# Patient Record
Sex: Female | Born: 1955 | Race: White | Hispanic: No | Marital: Married | Smoking: Former smoker
Health system: Southern US, Community
[De-identification: ages and names within clinical notes are randomized; demographics above are authoritative.]

## PROBLEM LIST (undated history)

## (undated) DIAGNOSIS — M1990 Unspecified osteoarthritis, unspecified site: Secondary | ICD-10-CM

## (undated) DIAGNOSIS — C44111 Basal cell carcinoma of skin of unspecified eyelid, including canthus: Secondary | ICD-10-CM

## (undated) DIAGNOSIS — T7840XA Allergy, unspecified, initial encounter: Secondary | ICD-10-CM

## (undated) DIAGNOSIS — G4730 Sleep apnea, unspecified: Secondary | ICD-10-CM

## (undated) DIAGNOSIS — C50911 Malignant neoplasm of unspecified site of right female breast: Secondary | ICD-10-CM

## (undated) DIAGNOSIS — IMO0002 Reserved for concepts with insufficient information to code with codable children: Secondary | ICD-10-CM

## (undated) DIAGNOSIS — D649 Anemia, unspecified: Secondary | ICD-10-CM

## (undated) DIAGNOSIS — C50919 Malignant neoplasm of unspecified site of unspecified female breast: Secondary | ICD-10-CM

## (undated) DIAGNOSIS — I10 Essential (primary) hypertension: Secondary | ICD-10-CM

## (undated) DIAGNOSIS — K649 Unspecified hemorrhoids: Secondary | ICD-10-CM

## (undated) DIAGNOSIS — D689 Coagulation defect, unspecified: Secondary | ICD-10-CM

## (undated) DIAGNOSIS — F258 Other schizoaffective disorders: Secondary | ICD-10-CM

## (undated) DIAGNOSIS — E669 Obesity, unspecified: Secondary | ICD-10-CM

## (undated) DIAGNOSIS — M109 Gout, unspecified: Secondary | ICD-10-CM

## (undated) DIAGNOSIS — Z9221 Personal history of antineoplastic chemotherapy: Secondary | ICD-10-CM

## (undated) DIAGNOSIS — R609 Edema, unspecified: Secondary | ICD-10-CM

## (undated) DIAGNOSIS — I509 Heart failure, unspecified: Secondary | ICD-10-CM

## (undated) HISTORY — DX: Coagulation defect, unspecified: D68.9

## (undated) HISTORY — DX: Gout, unspecified: M10.9

## (undated) HISTORY — PX: OTHER SURGICAL HISTORY: SHX169

## (undated) HISTORY — DX: Unspecified hemorrhoids: K64.9

## (undated) HISTORY — DX: Other schizoaffective disorders: F25.8

## (undated) HISTORY — DX: Malignant neoplasm of unspecified site of unspecified female breast: C50.919

## (undated) HISTORY — DX: Anemia, unspecified: D64.9

## (undated) HISTORY — PX: TUBAL LIGATION: SHX77

## (undated) HISTORY — DX: Allergy, unspecified, initial encounter: T78.40XA

## (undated) HISTORY — DX: Malignant neoplasm of unspecified site of right female breast: C50.911

## (undated) HISTORY — DX: Obesity, unspecified: E66.9

## (undated) HISTORY — DX: Basal cell carcinoma of skin of unspecified eyelid, including canthus: C44.111

## (undated) HISTORY — DX: Edema, unspecified: R60.9

## (undated) HISTORY — DX: Unspecified osteoarthritis, unspecified site: M19.90

## (undated) HISTORY — PX: ABDOMINAL HYSTERECTOMY: SHX81

## (undated) HISTORY — PX: CHOLECYSTECTOMY: SHX55

---

## 2002-05-05 DIAGNOSIS — F259 Schizoaffective disorder, unspecified: Secondary | ICD-10-CM

## 2002-05-05 HISTORY — DX: Schizoaffective disorder, unspecified: F25.9

## 2005-05-05 DIAGNOSIS — C50911 Malignant neoplasm of unspecified site of right female breast: Secondary | ICD-10-CM

## 2005-05-05 DIAGNOSIS — IMO0001 Reserved for inherently not codable concepts without codable children: Secondary | ICD-10-CM

## 2005-05-05 DIAGNOSIS — C50919 Malignant neoplasm of unspecified site of unspecified female breast: Secondary | ICD-10-CM

## 2005-05-05 DIAGNOSIS — Z9221 Personal history of antineoplastic chemotherapy: Secondary | ICD-10-CM

## 2005-05-05 HISTORY — DX: Personal history of antineoplastic chemotherapy: Z92.21

## 2005-05-05 HISTORY — DX: Malignant neoplasm of unspecified site of right female breast: C50.911

## 2005-05-05 HISTORY — DX: Malignant neoplasm of unspecified site of unspecified female breast: C50.919

## 2005-05-05 HISTORY — PX: BREAST LUMPECTOMY: SHX2

## 2005-05-05 HISTORY — DX: Reserved for inherently not codable concepts without codable children: IMO0001

## 2005-12-16 ENCOUNTER — Ambulatory Visit: Payer: Self-pay

## 2006-01-08 ENCOUNTER — Ambulatory Visit: Payer: Self-pay | Admitting: Surgery

## 2006-01-13 ENCOUNTER — Other Ambulatory Visit: Payer: Self-pay

## 2006-01-16 ENCOUNTER — Inpatient Hospital Stay: Payer: Self-pay | Admitting: Surgery

## 2006-01-26 ENCOUNTER — Ambulatory Visit: Payer: Self-pay | Admitting: Oncology

## 2006-02-16 ENCOUNTER — Ambulatory Visit: Payer: Self-pay | Admitting: Oncology

## 2006-03-05 ENCOUNTER — Ambulatory Visit: Payer: Self-pay | Admitting: Oncology

## 2006-03-09 ENCOUNTER — Other Ambulatory Visit: Payer: Self-pay

## 2006-03-12 ENCOUNTER — Ambulatory Visit: Payer: Self-pay | Admitting: Surgery

## 2006-04-04 ENCOUNTER — Ambulatory Visit: Payer: Self-pay | Admitting: Oncology

## 2006-05-05 ENCOUNTER — Ambulatory Visit: Payer: Self-pay | Admitting: Oncology

## 2006-06-05 ENCOUNTER — Ambulatory Visit: Payer: Self-pay | Admitting: Oncology

## 2006-07-04 ENCOUNTER — Ambulatory Visit: Payer: Self-pay | Admitting: Oncology

## 2006-07-22 ENCOUNTER — Ambulatory Visit: Payer: Self-pay | Admitting: Radiation Oncology

## 2006-08-04 ENCOUNTER — Ambulatory Visit: Payer: Self-pay | Admitting: Oncology

## 2006-08-04 ENCOUNTER — Ambulatory Visit: Payer: Self-pay | Admitting: Radiation Oncology

## 2006-09-03 ENCOUNTER — Ambulatory Visit: Payer: Self-pay | Admitting: Oncology

## 2006-09-03 ENCOUNTER — Ambulatory Visit: Payer: Self-pay | Admitting: Radiation Oncology

## 2006-10-04 ENCOUNTER — Ambulatory Visit: Payer: Self-pay | Admitting: Oncology

## 2006-10-04 ENCOUNTER — Ambulatory Visit: Payer: Self-pay | Admitting: Radiation Oncology

## 2006-11-03 ENCOUNTER — Ambulatory Visit: Payer: Self-pay | Admitting: Radiation Oncology

## 2006-11-03 ENCOUNTER — Ambulatory Visit: Payer: Self-pay | Admitting: Oncology

## 2006-12-04 ENCOUNTER — Ambulatory Visit: Payer: Self-pay | Admitting: Radiation Oncology

## 2006-12-04 ENCOUNTER — Ambulatory Visit: Payer: Self-pay | Admitting: Oncology

## 2006-12-14 ENCOUNTER — Ambulatory Visit: Payer: Self-pay | Admitting: Gastroenterology

## 2006-12-22 ENCOUNTER — Ambulatory Visit: Payer: Self-pay | Admitting: Gastroenterology

## 2006-12-23 ENCOUNTER — Ambulatory Visit: Payer: Self-pay

## 2007-01-04 ENCOUNTER — Ambulatory Visit: Payer: Self-pay | Admitting: Oncology

## 2007-02-03 ENCOUNTER — Ambulatory Visit: Payer: Self-pay | Admitting: Oncology

## 2007-02-25 ENCOUNTER — Ambulatory Visit: Payer: Self-pay | Admitting: Oncology

## 2007-03-06 ENCOUNTER — Ambulatory Visit: Payer: Self-pay | Admitting: Oncology

## 2007-05-06 ENCOUNTER — Ambulatory Visit: Payer: Self-pay | Admitting: Oncology

## 2007-05-19 ENCOUNTER — Ambulatory Visit: Payer: Self-pay | Admitting: Oncology

## 2007-06-06 ENCOUNTER — Ambulatory Visit: Payer: Self-pay | Admitting: Oncology

## 2007-09-03 ENCOUNTER — Ambulatory Visit: Payer: Self-pay | Admitting: Oncology

## 2007-09-30 ENCOUNTER — Ambulatory Visit: Payer: Self-pay | Admitting: Oncology

## 2007-10-04 ENCOUNTER — Ambulatory Visit: Payer: Self-pay | Admitting: Oncology

## 2007-12-24 ENCOUNTER — Ambulatory Visit: Payer: Self-pay | Admitting: Oncology

## 2008-01-04 ENCOUNTER — Ambulatory Visit: Payer: Self-pay | Admitting: Oncology

## 2008-01-20 ENCOUNTER — Ambulatory Visit: Payer: Self-pay | Admitting: Oncology

## 2008-02-03 ENCOUNTER — Ambulatory Visit: Payer: Self-pay | Admitting: Oncology

## 2008-02-11 ENCOUNTER — Ambulatory Visit: Payer: Self-pay | Admitting: Family Medicine

## 2008-05-05 ENCOUNTER — Ambulatory Visit: Payer: Self-pay | Admitting: Oncology

## 2008-05-11 ENCOUNTER — Ambulatory Visit: Payer: Self-pay | Admitting: Oncology

## 2008-06-05 ENCOUNTER — Ambulatory Visit: Payer: Self-pay | Admitting: Oncology

## 2008-09-25 ENCOUNTER — Ambulatory Visit: Payer: Self-pay | Admitting: Neurosurgery

## 2008-10-20 ENCOUNTER — Ambulatory Visit (HOSPITAL_COMMUNITY): Admission: RE | Admit: 2008-10-20 | Discharge: 2008-10-21 | Payer: Self-pay | Admitting: Neurosurgery

## 2008-11-02 ENCOUNTER — Ambulatory Visit: Payer: Self-pay | Admitting: Oncology

## 2008-11-09 ENCOUNTER — Ambulatory Visit: Payer: Self-pay | Admitting: Oncology

## 2008-12-03 ENCOUNTER — Ambulatory Visit: Payer: Self-pay | Admitting: Oncology

## 2009-01-03 ENCOUNTER — Ambulatory Visit: Payer: Self-pay | Admitting: Oncology

## 2009-02-02 ENCOUNTER — Ambulatory Visit: Payer: Self-pay | Admitting: Oncology

## 2009-03-05 ENCOUNTER — Ambulatory Visit: Payer: Self-pay | Admitting: Oncology

## 2009-05-05 ENCOUNTER — Ambulatory Visit: Payer: Self-pay | Admitting: Oncology

## 2009-05-31 ENCOUNTER — Ambulatory Visit: Payer: Self-pay | Admitting: Oncology

## 2009-06-05 ENCOUNTER — Ambulatory Visit: Payer: Self-pay | Admitting: Oncology

## 2009-12-03 ENCOUNTER — Ambulatory Visit: Payer: Self-pay | Admitting: Oncology

## 2009-12-05 ENCOUNTER — Ambulatory Visit: Payer: Self-pay | Admitting: Oncology

## 2010-01-03 ENCOUNTER — Ambulatory Visit: Payer: Self-pay | Admitting: Oncology

## 2010-02-02 ENCOUNTER — Ambulatory Visit: Payer: Self-pay | Admitting: Oncology

## 2010-05-30 ENCOUNTER — Ambulatory Visit: Payer: Self-pay | Admitting: Oncology

## 2010-05-31 LAB — CANCER ANTIGEN 27.29: CA 27.29: 11.1 U/mL (ref 0.0–38.6)

## 2010-06-05 ENCOUNTER — Ambulatory Visit: Payer: Self-pay | Admitting: Oncology

## 2010-08-12 LAB — BASIC METABOLIC PANEL
Calcium: 9.4 mg/dL (ref 8.4–10.5)
GFR calc Af Amer: >60 mL/min (ref >60)
GFR calc non Af Amer: >60 mL/min (ref >60)
Glucose, Bld: 107 mg/dL — ABNORMAL HIGH (ref 70–99)
Sodium: 142 mEq/L (ref 135–145)

## 2010-08-12 LAB — GLUCOSE, CAPILLARY
Glucose-Capillary: 149 mg/dL — ABNORMAL HIGH (ref 70–99)
Glucose-Capillary: 163 mg/dL — ABNORMAL HIGH (ref 70–99)

## 2010-08-12 LAB — CBC
Hemoglobin: 13.3 g/dL (ref 12.0–15.0)
RBC: 4.6 MIL/uL (ref 3.87–5.11)
RDW: 15.9 % — ABNORMAL HIGH (ref 11.5–15.5)

## 2010-09-17 NOTE — Op Note (Signed)
NAME:  Amy Buchanan, FREELS NO.:  [id number]   MEDICAL RECORD NO.:  [id number]          PATIENT TYPE:  OIB   LOCATION:  3534                         FACILITY:  [facility]   PHYSICIAN:  Danae Orleans. Venetia Maxon, M.D.  DATE OF BIRTH:  [birth year]   DATE OF PROCEDURE:  10/20/2008  DATE OF DISCHARGE:                               OPERATIVE REPORT   PREOPERATIVE DIAGNOSES:  Cervical stenosis, cervical spondylosis,  cervical disk herniation, cervical radiculopathy, and cervical  myelopathy with herniated cervical disks at C4-5, C5-6, and C6-7 levels.   POSTOPERATIVE DIAGNOSES:  Cervical stenosis, cervical spondylosis,  cervical disk herniation, cervical radiculopathy, and cervical  myelopathy with herniated cervical disks at C4-5, C5-6, and C6-7 levels.   PROCEDURE:  Anterior cervical decompression and fusion at C4-5, C5-6,  and C6-7 levels with allograft bone grafts, morselized bone autograft,  EquivaBone, and anterior cervical plate.   SURGEON:  Danae Orleans. Venetia Maxon, MD   ASSISTANT:  1. Hewitt Shorts, MD  2. Georgiann Cocker, RN   ANESTHESIA:  General endotracheal anesthesia.   ESTIMATED BLOOD LOSS:  150 mL.   COMPLICATIONS:  None.   DISPOSITION:  Recovery.   INDICATIONS:  Amy Buchanan is a 55 year old woman with cervical  spondylosis, herniated cervical disks, right greater than left arm pain  and weakness, and cervical stenosis at the C4-5, C5-6, and C6-7 levels.  It was elected to take her to surgery for anterior cervical  decompression and fusion at these affected levels.   PROCEDURE:  [name] was brought to the operating room.  Following  satisfactory and uncomplicated induction of general endotracheal  anesthesia and placement of intravenous lines, the patient was placed in  the supine position on the operating table.  She was placed in 5 pounds  of halter traction.  Her shoulders were taped down with ABD pads and  also wrapped because of the patient's extremely  large size.  Her  anterior neck was then prepped and draped in the usual sterile fashion.  Area of planned incision was infiltrated with local lidocaine.  An  incision was made from midline to the anterior border of the  sternocleidomastoid on the left side of midline and carried to the  platysma layer, exposing the anterior border of the sternocleidomastoid  muscle.  Using blunt dissection, the carotid sheath was kept lateral and  trachea and esophagus kept medial exposing the anterior cervical spine.  A bent spinal needle was placed in what was felt to be the C3-4 and C4-5  levels and this was confirmed on intraoperative x-ray.  Subsequently,  the longus colli muscles were taken down from the anterior cervical  spine from C4-C7 levels using electrocautery, and Key elevator and a  shadow line retractor along with up and down retractor blades were  placed to facilitate exposure.  Distraction pins were placed initially  at C4 and C7, and using gentle distraction, the interspace were incised  and disk material was removed in a piecemeal fashion.  Endplates were  stripped of residual disk material.  A thorough diskectomy was performed  after initially decorticating the endplates with a  high-speed drill and  bone autograft was saved for later use with bone grafting.  Uncinate  spurs were removed and the central spinal cord dura was decompressed as  were both neural foramina.  Hemostasis was assured, and after trial  sizing, a 6-mm allograft bone wedge was selected and fashioned with a  high-speed drill, packed with morselized bone autograft and EquivaBone,  inserted in the interspace countersunk appropriately.  Attention was  then turned to the C6-7 level where a similar decompression was  performed.  The distraction pins were removed at C6-C7 and there was a  significant amount of herniated disk material on the right side of  midline with significant compression of the right C7 nerve root.   Both  the C7 nerve roots and cervical spinal cord dura were decompressed and  after trial sizing an 8-mm bone allograft, wedge was selected and  fashioned with a high-speed drill, packed with morcellized bone  autograft and EquivaBone, inserted in the interspace and countersunk  appropriately.  Attention was then turned to the C5-6 level and  distraction pins were removed at C5 and C6.  The endplates were  decorticated and uncinate spurs were drilled down.  Spinal cord dura and  both C6 nerve roots were widely decompressed.  Hemostasis was assured,  and after trial sizing, a 7-mm allograft bone wedge was selected and  fashioned with a high-speed drill, packed with morcellized bone  autograft and EquivaBone, inserted in the interspace and countersunk  appropriately.  A 48-mm Trestle anterior cervical plate was affixed to  anterior cervical spine after traction weight was removed with 14 mm  variable angle screws, two at C4, two at C5, two at C6, and two at C7,  all screws had excellent purchase.  Locking mechanisms were engaged.  Final x-ray demonstrated well-positioned of interbody graft and the  anterior cervical plate at the upper aspect of the construct.  Because  of the patient's large body habitus, it was not possible to visualize  the lower aspect.  The wound was irrigated.  Soft tissue were inspected  and found to be in good repair.  There was no evidence of any bleeding.  The platysma layer was closed with 3-0 Vicryl sutures and skin edges  were re-approximated with 3-0 Vicryl subcuticular stitch.  Wound was  dressed with Dermabond.  The patient was extubated in the operating room  and taken to the recovery room in stable satisfactory condition having  tolerated the operation well.  Counts were correct at the end of the  case.      Danae Orleans. Venetia Maxon, M.D.  Electronically Signed     JDS/MEDQ  D:  10/20/2008  T:  10/21/2008  Job:  [id number]

## 2010-11-29 ENCOUNTER — Ambulatory Visit: Payer: Self-pay | Admitting: Oncology

## 2010-11-30 LAB — CANCER ANTIGEN 27.29: CA 27.29: 9.3 U/mL (ref 0.0–38.6)

## 2010-12-04 ENCOUNTER — Ambulatory Visit: Payer: Self-pay | Admitting: Oncology

## 2011-01-20 ENCOUNTER — Ambulatory Visit: Payer: Self-pay | Admitting: Oncology

## 2011-06-09 ENCOUNTER — Ambulatory Visit: Payer: Self-pay | Admitting: Oncology

## 2011-06-09 LAB — CBC CANCER CENTER
Eosinophil %: 2.4 %
Lymphocyte #: 2.1 x10 3/mm (ref 1.0–3.6)
MCH: 27.4 pg (ref 26.0–34.0)
MCV: 83 fL (ref 80–100)
Monocyte #: 0.6 x10 3/mm (ref 0.0–0.7)
Platelet: 282 x10 3/mm (ref 150–440)
WBC: 9.9 x10 3/mm (ref 3.6–11.0)

## 2011-06-09 LAB — COMPREHENSIVE METABOLIC PANEL
Albumin: 3.4 g/dL (ref 3.4–5.0)
Anion Gap: 9 (ref 7–16)
BUN: 13 mg/dL (ref 7–18)
Bilirubin,Total: 0.2 mg/dL (ref 0.2–1.0)
Chloride: 102 mmol/L (ref 98–107)
Creatinine: 0.88 mg/dL (ref 0.60–1.30)
EGFR (African American): >60
Glucose: 127 mg/dL — ABNORMAL HIGH (ref 65–99)
Potassium: 3.8 mmol/L (ref 3.5–5.1)
SGOT(AST): 51 U/L — ABNORMAL HIGH (ref 15–37)
SGPT (ALT): 67 U/L
Total Protein: 6.6 g/dL (ref 6.4–8.2)

## 2011-07-04 ENCOUNTER — Ambulatory Visit: Payer: Self-pay | Admitting: Oncology

## 2011-08-07 ENCOUNTER — Ambulatory Visit: Payer: Self-pay | Admitting: General Surgery

## 2011-08-22 ENCOUNTER — Ambulatory Visit: Payer: Self-pay | Admitting: General Surgery

## 2011-12-17 ENCOUNTER — Ambulatory Visit: Payer: Self-pay | Admitting: Oncology

## 2011-12-17 LAB — COMPREHENSIVE METABOLIC PANEL
Albumin: 3.7 g/dL (ref 3.4–5.0)
Alkaline Phosphatase: 97 U/L (ref 50–136)
Calcium, Total: 8.8 mg/dL (ref 8.5–10.1)
Co2: 25 mmol/L (ref 21–32)
EGFR (Non-African Amer.): 57 — ABNORMAL LOW
Glucose: 166 mg/dL — ABNORMAL HIGH (ref 65–99)
SGOT(AST): 14 U/L — ABNORMAL LOW (ref 15–37)
SGPT (ALT): 32 U/L (ref 12–78)
Sodium: 139 mmol/L (ref 136–145)

## 2011-12-17 LAB — CBC CANCER CENTER
Basophil %: 0.8 %
Eosinophil #: 0.1 x10 3/mm (ref 0.0–0.7)
Lymphocyte %: 16.8 %
Monocyte #: 0.3 x10 3/mm (ref 0.2–0.9)
Neutrophil %: 78.7 %
Platelet: 250 x10 3/mm (ref 150–440)
RDW: 18.3 % — ABNORMAL HIGH (ref 11.5–14.5)
WBC: 10.7 x10 3/mm (ref 3.6–11.0)

## 2011-12-18 LAB — CANCER ANTIGEN 27.29: CA 27.29: 9.9 U/mL (ref 0.0–38.6)

## 2012-01-04 ENCOUNTER — Ambulatory Visit: Payer: Self-pay | Admitting: Oncology

## 2012-02-03 ENCOUNTER — Ambulatory Visit: Payer: Self-pay | Admitting: Oncology

## 2012-02-18 ENCOUNTER — Ambulatory Visit: Payer: Self-pay | Admitting: Family Medicine

## 2012-05-05 HISTORY — PX: UPPER GI ENDOSCOPY: SHX6162

## 2012-05-05 HISTORY — PX: COLONOSCOPY: SHX174

## 2012-06-08 ENCOUNTER — Ambulatory Visit: Payer: Self-pay | Admitting: Oncology

## 2012-06-09 LAB — COMPREHENSIVE METABOLIC PANEL
Albumin: 3.5 g/dL (ref 3.4–5.0)
Alkaline Phosphatase: 130 U/L (ref 50–136)
Anion Gap: 5 — ABNORMAL LOW (ref 7–16)
BUN: 18 mg/dL (ref 7–18)
Calcium, Total: 8.8 mg/dL (ref 8.5–10.1)
Chloride: 102 mmol/L (ref 98–107)
Creatinine: 1 mg/dL (ref 0.60–1.30)
EGFR (Non-African Amer.): >60
Osmolality: 283 (ref 275–301)
Potassium: 4.5 mmol/L (ref 3.5–5.1)
Sodium: 141 mmol/L (ref 136–145)

## 2012-06-09 LAB — CBC CANCER CENTER
Basophil #: 0.1 x10 3/mm (ref 0.0–0.1)
Basophil %: 0.9 %
Eosinophil %: 2.2 %
HCT: 42.8 % (ref 35.0–47.0)
HGB: 13.8 g/dL (ref 12.0–16.0)
Lymphocyte #: 2.4 x10 3/mm (ref 1.0–3.6)
MCV: 77 fL — ABNORMAL LOW (ref 80–100)
Monocyte #: 1.1 x10 3/mm — ABNORMAL HIGH (ref 0.2–0.9)
Platelet: 302 x10 3/mm (ref 150–440)
RBC: 5.59 10*6/uL — ABNORMAL HIGH (ref 3.80–5.20)
RDW: 19 % — ABNORMAL HIGH (ref 11.5–14.5)
WBC: 14.5 x10 3/mm — ABNORMAL HIGH (ref 3.6–11.0)

## 2012-06-10 LAB — CANCER ANTIGEN 27.29: CA 27.29: 7.4 U/mL (ref 0.0–38.6)

## 2012-07-03 ENCOUNTER — Ambulatory Visit: Payer: Self-pay | Admitting: Oncology

## 2012-09-21 ENCOUNTER — Ambulatory Visit: Payer: Self-pay | Admitting: Family Medicine

## 2012-09-23 ENCOUNTER — Inpatient Hospital Stay: Payer: Self-pay | Admitting: Specialist

## 2012-09-23 LAB — COMPREHENSIVE METABOLIC PANEL
Albumin: 4 g/dL (ref 3.4–5.0)
BUN: 12 mg/dL (ref 7–18)
Calcium, Total: 9.2 mg/dL (ref 8.5–10.1)
Co2: 29 mmol/L (ref 21–32)
Glucose: 100 mg/dL — ABNORMAL HIGH (ref 65–99)
Osmolality: 279 (ref 275–301)
Potassium: 3.6 mmol/L (ref 3.5–5.1)

## 2012-09-23 LAB — CBC
HGB: 12.5 g/dL (ref 12.0–16.0)
MCHC: 31.6 g/dL — ABNORMAL LOW (ref 32.0–36.0)
MCV: 77 fL — ABNORMAL LOW (ref 80–100)
RBC: 5.11 10*6/uL (ref 3.80–5.20)

## 2012-09-23 LAB — URINALYSIS, COMPLETE
Bacteria: NONE SEEN
Glucose,UR: NEGATIVE mg/dL (ref 0–75)
Ph: 5 (ref 4.5–8.0)
Protein: NEGATIVE
RBC,UR: 1 /HPF (ref 0–5)
Specific Gravity: 1.01 (ref 1.003–1.030)
Squamous Epithelial: 1

## 2012-09-23 LAB — CK TOTAL AND CKMB (NOT AT ARMC)
CK, Total: 99 U/L (ref 21–215)
CK-MB: 0.8 ng/mL (ref 0.5–3.6)
CK-MB: 0.6 ng/mL (ref 0.5–3.6)

## 2012-09-23 LAB — TROPONIN I: Troponin-I: <0.02 ng/mL

## 2012-09-23 LAB — PRO B NATRIURETIC PEPTIDE: B-Type Natriuretic Peptide: 567 pg/mL — ABNORMAL HIGH (ref 0–125)

## 2012-09-24 LAB — BASIC METABOLIC PANEL
Anion Gap: 4 — ABNORMAL LOW (ref 7–16)
Calcium, Total: 8.5 mg/dL (ref 8.5–10.1)
Co2: 31 mmol/L (ref 21–32)
EGFR (African American): >60
EGFR (Non-African Amer.): >60
Osmolality: 279 (ref 275–301)
Potassium: 3.7 mmol/L (ref 3.5–5.1)
Sodium: 140 mmol/L (ref 136–145)

## 2012-09-24 LAB — CK TOTAL AND CKMB (NOT AT ARMC)
CK, Total: 72 U/L (ref 21–215)
CK-MB: <0.5 ng/mL — ABNORMAL LOW (ref 0.5–3.6)

## 2012-09-25 LAB — BASIC METABOLIC PANEL
Anion Gap: 8 (ref 7–16)
BUN: 12 mg/dL (ref 7–18)
Chloride: 103 mmol/L (ref 98–107)
Co2: 27 mmol/L (ref 21–32)
Creatinine: 0.77 mg/dL (ref 0.60–1.30)
EGFR (African American): >60
EGFR (Non-African Amer.): >60
Glucose: 115 mg/dL — ABNORMAL HIGH (ref 65–99)

## 2013-01-24 ENCOUNTER — Ambulatory Visit: Payer: Self-pay | Admitting: Oncology

## 2013-02-22 ENCOUNTER — Ambulatory Visit: Payer: Self-pay | Admitting: Oncology

## 2013-02-22 LAB — COMPREHENSIVE METABOLIC PANEL
Alkaline Phosphatase: 100 U/L (ref 50–136)
Anion Gap: 10 (ref 7–16)
BUN: 16 mg/dL (ref 7–18)
Bilirubin,Total: 0.6 mg/dL (ref 0.2–1.0)
Chloride: 105 mmol/L (ref 98–107)
Co2: 27 mmol/L (ref 21–32)
Creatinine: 1.13 mg/dL (ref 0.60–1.30)
Osmolality: 284 (ref 275–301)
Sodium: 142 mmol/L (ref 136–145)

## 2013-02-22 LAB — CBC CANCER CENTER
Basophil #: 0.1 x10 3/mm (ref 0.0–0.1)
Eosinophil #: 0.2 x10 3/mm (ref 0.0–0.7)
HCT: 41.2 % (ref 35.0–47.0)
Lymphocyte %: 19.4 %
MCH: 26.8 pg (ref 26.0–34.0)
Monocyte #: 0.7 x10 3/mm (ref 0.2–0.9)
Neutrophil %: 70.4 %
Platelet: 241 x10 3/mm (ref 150–440)
RBC: 4.97 10*6/uL (ref 3.80–5.20)

## 2013-03-05 ENCOUNTER — Ambulatory Visit: Payer: Self-pay | Admitting: Oncology

## 2013-03-28 ENCOUNTER — Inpatient Hospital Stay: Payer: Self-pay | Admitting: Surgery

## 2013-03-28 LAB — COMPREHENSIVE METABOLIC PANEL
Alkaline Phosphatase: 99 U/L
BUN: 16 mg/dL (ref 7–18)
Creatinine: 1.05 mg/dL (ref 0.60–1.30)
EGFR (African American): >60
Glucose: 126 mg/dL — ABNORMAL HIGH (ref 65–99)
Potassium: 3.7 mmol/L (ref 3.5–5.1)
SGOT(AST): 10 U/L — ABNORMAL LOW (ref 15–37)
SGPT (ALT): 19 U/L (ref 12–78)

## 2013-03-28 LAB — CBC WITH DIFFERENTIAL/PLATELET
Basophil #: 0.1 10*3/uL (ref 0.0–0.1)
Basophil %: 0.6 %
Eosinophil #: 0.2 10*3/uL (ref 0.0–0.7)
HCT: 39.7 % (ref 35.0–47.0)
HGB: 12.6 g/dL (ref 12.0–16.0)
Lymphocyte #: 4 10*3/uL — ABNORMAL HIGH (ref 1.0–3.6)
Lymphocyte %: 23.2 %
MCV: 83 fL (ref 80–100)
Monocyte #: 1.3 x10 3/mm — ABNORMAL HIGH (ref 0.2–0.9)
Monocyte %: 7.6 %
Neutrophil #: 11.5 10*3/uL — ABNORMAL HIGH (ref 1.4–6.5)
Platelet: 282 10*3/uL (ref 150–440)
RBC: 4.81 10*6/uL (ref 3.80–5.20)
WBC: 17.2 10*3/uL — ABNORMAL HIGH (ref 3.6–11.0)

## 2013-03-28 LAB — PROTIME-INR
INR: 1.2
Prothrombin Time: 15.3 secs — ABNORMAL HIGH (ref 11.5–14.7)

## 2013-03-28 LAB — APTT: Activated PTT: 33.4 secs (ref 23.6–35.9)

## 2013-03-29 LAB — URINALYSIS, COMPLETE
Bilirubin,UR: NEGATIVE
Glucose,UR: NEGATIVE mg/dL (ref 0–75)
Ketone: NEGATIVE
Leukocyte Esterase: NEGATIVE
Ph: 5 (ref 4.5–8.0)
RBC,UR: 2 /HPF (ref 0–5)
Specific Gravity: 1.019 (ref 1.003–1.030)
WBC UR: 2 /HPF (ref 0–5)

## 2013-03-29 LAB — CBC WITH DIFFERENTIAL/PLATELET
Bands: 21 %
Basophil #: 0.1 10*3/uL (ref 0.0–0.1)
Basophil %: 0.3 %
Eosinophil #: 0 10*3/uL (ref 0.0–0.7)
Eosinophil %: 0.1 %
HCT: 28.2 % — ABNORMAL LOW (ref 35.0–47.0)
Lymphocyte %: 5.9 %
Lymphocytes: 14 %
MCH: 27 pg (ref 26.0–34.0)
MCHC: 33.6 g/dL (ref 32.0–36.0)
MCV: 84 fL (ref 80–100)
Metamyelocyte: 1 %
Monocyte #: 1.8 x10 3/mm — ABNORMAL HIGH (ref 0.2–0.9)
Monocyte %: 7.1 %
Myelocyte: 1 %
Platelet: 254 10*3/uL (ref 150–440)
Platelet: 222 10*3/uL (ref 150–440)
RBC: 3.37 10*6/uL — ABNORMAL LOW (ref 3.80–5.20)
Segmented Neutrophils: 56 %
WBC: 24.7 10*3/uL — ABNORMAL HIGH (ref 3.6–11.0)

## 2013-03-29 LAB — BASIC METABOLIC PANEL
BUN: 22 mg/dL — ABNORMAL HIGH (ref 7–18)
BUN: 20 mg/dL — ABNORMAL HIGH (ref 7–18)
Calcium, Total: 7.8 mg/dL — ABNORMAL LOW (ref 8.5–10.1)
Calcium, Total: 7 mg/dL — CL (ref 8.5–10.1)
Chloride: 113 mmol/L — ABNORMAL HIGH (ref 98–107)
Co2: 22 mmol/L (ref 21–32)
Creatinine: 0.98 mg/dL (ref 0.60–1.30)
Creatinine: 1.13 mg/dL (ref 0.60–1.30)
EGFR (African American): >60
EGFR (African American): >60
EGFR (Non-African Amer.): >60
EGFR (Non-African Amer.): 54 — ABNORMAL LOW
Glucose: 178 mg/dL — ABNORMAL HIGH (ref 65–99)
Osmolality: 297 (ref 275–301)
Osmolality: 301 (ref 275–301)
Potassium: 3.8 mmol/L (ref 3.5–5.1)
Sodium: 146 mmol/L — ABNORMAL HIGH (ref 136–145)
Sodium: 148 mmol/L — ABNORMAL HIGH (ref 136–145)

## 2013-03-29 LAB — HEMOGLOBIN: HGB: 8.7 g/dL — ABNORMAL LOW (ref 12.0–16.0)

## 2013-03-30 LAB — BASIC METABOLIC PANEL
Anion Gap: 2 — ABNORMAL LOW (ref 7–16)
BUN: 20 mg/dL — ABNORMAL HIGH (ref 7–18)
Co2: 28 mmol/L (ref 21–32)
EGFR (African American): >60
Osmolality: 295 (ref 275–301)
Sodium: 145 mmol/L (ref 136–145)

## 2013-03-30 LAB — HEMOGLOBIN: HGB: 7 g/dL — ABNORMAL LOW (ref 12.0–16.0)

## 2013-03-30 LAB — CBC WITH DIFFERENTIAL/PLATELET
Basophil #: 0 10*3/uL (ref 0.0–0.1)
Eosinophil #: 0 10*3/uL (ref 0.0–0.7)
Eosinophil %: 0 %
HCT: 22.1 % — ABNORMAL LOW (ref 35.0–47.0)
HGB: 7.5 g/dL — ABNORMAL LOW (ref 12.0–16.0)
Lymphocyte #: 1.5 10*3/uL (ref 1.0–3.6)
Lymphocyte %: 10.5 %
MCHC: 33.9 g/dL (ref 32.0–36.0)
MCV: 82 fL (ref 80–100)
Monocyte %: 8.3 %
Neutrophil #: 11.2 10*3/uL — ABNORMAL HIGH (ref 1.4–6.5)
RBC: 2.69 10*6/uL — ABNORMAL LOW (ref 3.80–5.20)

## 2013-04-01 LAB — CBC WITH DIFFERENTIAL/PLATELET
Basophil #: 0 10*3/uL (ref 0.0–0.1)
Basophil #: 0 10*3/uL (ref 0.0–0.1)
Basophil %: 0.3 %
Eosinophil #: 0.1 10*3/uL (ref 0.0–0.7)
Eosinophil #: 0.1 10*3/uL (ref 0.0–0.7)
Eosinophil %: 1.2 %
Eosinophil %: 0.9 %
HCT: 21.9 % — ABNORMAL LOW (ref 35.0–47.0)
HCT: 23 % — ABNORMAL LOW (ref 35.0–47.0)
HGB: 7.8 g/dL — ABNORMAL LOW (ref 12.0–16.0)
Lymphocyte #: 1.1 10*3/uL (ref 1.0–3.6)
Lymphocyte #: 1.6 10*3/uL (ref 1.0–3.6)
Lymphocyte %: 12 %
Lymphocyte %: 11 %
MCH: 29.5 pg (ref 26.0–34.0)
MCHC: 34.5 g/dL (ref 32.0–36.0)
MCV: 86 fL (ref 80–100)
MCV: 85 fL (ref 80–100)
Monocyte #: 1.1 x10 3/mm — ABNORMAL HIGH (ref 0.2–0.9)
Monocyte %: 11.7 %
Neutrophil #: 7.2 10*3/uL — ABNORMAL HIGH (ref 1.4–6.5)
Neutrophil #: 11 10*3/uL — ABNORMAL HIGH (ref 1.4–6.5)
Neutrophil %: 77 %
RBC: 2.7 10*6/uL — ABNORMAL LOW (ref 3.80–5.20)
RDW: 16.5 % — ABNORMAL HIGH (ref 11.5–14.5)
WBC: 9.6 10*3/uL (ref 3.6–11.0)
WBC: 14.3 10*3/uL — ABNORMAL HIGH (ref 3.6–11.0)

## 2013-04-01 LAB — FIBRINOGEN: Fibrinogen: 341 mg/dL (ref 210–470)

## 2013-04-01 LAB — PROTIME-INR
INR: 1.2
Prothrombin Time: 15.6 secs — ABNORMAL HIGH (ref 11.5–14.7)

## 2013-04-02 LAB — BASIC METABOLIC PANEL
Anion Gap: 4 — ABNORMAL LOW (ref 7–16)
BUN: 10 mg/dL (ref 7–18)
Calcium, Total: 7.6 mg/dL — ABNORMAL LOW (ref 8.5–10.1)
Chloride: 111 mmol/L — ABNORMAL HIGH (ref 98–107)
Glucose: 134 mg/dL — ABNORMAL HIGH (ref 65–99)
Osmolality: 284 (ref 275–301)

## 2013-04-02 LAB — CBC WITH DIFFERENTIAL/PLATELET
Bands: 5 %
Eosinophil: 1 %
HCT: 28 % — ABNORMAL LOW (ref 35.0–47.0)
Lymphocytes: 13 %
MCH: 28.7 pg (ref 26.0–34.0)
MCHC: 33.8 g/dL (ref 32.0–36.0)
Metamyelocyte: 1 %
Monocytes: 7 %
Myelocyte: 2 %
NRBC/100 WBC: 3 /
RBC: 3.29 10*6/uL — ABNORMAL LOW (ref 3.80–5.20)
Segmented Neutrophils: 70 %

## 2013-04-03 LAB — CBC WITH DIFFERENTIAL/PLATELET
Bands: 8 %
Eosinophil: 1 %
HCT: 27 % — ABNORMAL LOW (ref 35.0–47.0)
HGB: 9 g/dL — ABNORMAL LOW (ref 12.0–16.0)
Lymphocytes: 12 %
MCH: 28.5 pg (ref 26.0–34.0)
MCHC: 33.2 g/dL (ref 32.0–36.0)
MCV: 86 fL (ref 80–100)
Monocytes: 2 %
Myelocyte: 1 %
NRBC/100 WBC: 2 /
Platelet: 205 10*3/uL (ref 150–440)
RDW: 15.9 % — ABNORMAL HIGH (ref 11.5–14.5)
WBC: 19.4 10*3/uL — ABNORMAL HIGH (ref 3.6–11.0)

## 2013-04-03 LAB — URINALYSIS, COMPLETE
Glucose,UR: NEGATIVE mg/dL (ref 0–75)
Leukocyte Esterase: NEGATIVE
Nitrite: NEGATIVE
RBC,UR: 3 /HPF (ref 0–5)
Specific Gravity: 1.021 (ref 1.003–1.030)
Squamous Epithelial: 2
WBC UR: 13 /HPF (ref 0–5)

## 2013-04-04 LAB — CBC WITH DIFFERENTIAL/PLATELET
Bands: 5 %
Basophil: 3 %
MCH: 28 pg (ref 26.0–34.0)
MCHC: 32.7 g/dL (ref 32.0–36.0)
MCV: 86 fL (ref 80–100)
Monocytes: 3 %
Platelet: 246 10*3/uL (ref 150–440)
RBC: 2.82 10*6/uL — ABNORMAL LOW (ref 3.80–5.20)
RDW: 16 % — ABNORMAL HIGH (ref 11.5–14.5)
WBC: 21.1 10*3/uL — ABNORMAL HIGH (ref 3.6–11.0)

## 2013-04-05 LAB — CBC WITH DIFFERENTIAL/PLATELET
Basophil #: 0.1 10*3/uL (ref 0.0–0.1)
Basophil %: 0.7 %
Eosinophil #: 0.3 10*3/uL (ref 0.0–0.7)
Eosinophil %: 1.4 %
HCT: 23.3 % — ABNORMAL LOW (ref 35.0–47.0)
HGB: 7.8 g/dL — ABNORMAL LOW (ref 12.0–16.0)
Lymphocyte #: 1.4 10*3/uL (ref 1.0–3.6)
Lymphocyte %: 6.3 %
MCHC: 33.5 g/dL (ref 32.0–36.0)
Monocyte %: 4.5 %
Neutrophil #: 18.9 10*3/uL — ABNORMAL HIGH (ref 1.4–6.5)
Neutrophil %: 87.1 %
Platelet: 282 10*3/uL (ref 150–440)
RDW: 16.1 % — ABNORMAL HIGH (ref 11.5–14.5)
WBC: 21.7 10*3/uL — ABNORMAL HIGH (ref 3.6–11.0)

## 2013-04-06 LAB — CBC WITH DIFFERENTIAL/PLATELET
Bands: 8 %
HCT: 25.6 % — ABNORMAL LOW (ref 35.0–47.0)
Lymphocytes: 9 %
MCHC: 33.5 g/dL (ref 32.0–36.0)
Metamyelocyte: 3 %
Myelocyte: 2 %
NRBC/100 WBC: 1 /
Platelet: 327 10*3/uL (ref 150–440)
RDW: 16.9 % — ABNORMAL HIGH (ref 11.5–14.5)
Segmented Neutrophils: 73 %

## 2013-04-06 LAB — BASIC METABOLIC PANEL
BUN: 7 mg/dL (ref 7–18)
Calcium, Total: 7.6 mg/dL — ABNORMAL LOW (ref 8.5–10.1)
Chloride: 103 mmol/L (ref 98–107)
Co2: 27 mmol/L (ref 21–32)
Creatinine: 0.67 mg/dL (ref 0.60–1.30)
EGFR (African American): >60
EGFR (Non-African Amer.): >60
Glucose: 129 mg/dL — ABNORMAL HIGH (ref 65–99)
Osmolality: 275 (ref 275–301)

## 2013-04-07 LAB — BASIC METABOLIC PANEL
Creatinine: 0.81 mg/dL (ref 0.60–1.30)
EGFR (Non-African Amer.): >60
Glucose: 126 mg/dL — ABNORMAL HIGH (ref 65–99)
Osmolality: 278 (ref 275–301)
Potassium: 3.2 mmol/L — ABNORMAL LOW (ref 3.5–5.1)
Sodium: 139 mmol/L (ref 136–145)

## 2013-04-07 LAB — CBC WITH DIFFERENTIAL/PLATELET
Eosinophil: 2 %
HCT: 25 % — ABNORMAL LOW (ref 35.0–47.0)
Lymphocytes: 6 %
Metamyelocyte: 1 %
Monocytes: 4 %
Myelocyte: 1 %
NRBC/100 WBC: 1 /
RBC: 2.94 10*6/uL — ABNORMAL LOW (ref 3.80–5.20)
Segmented Neutrophils: 82 %
WBC: 24.5 10*3/uL — ABNORMAL HIGH (ref 3.6–11.0)

## 2013-04-08 LAB — BASIC METABOLIC PANEL
BUN: 9 mg/dL (ref 7–18)
Chloride: 104 mmol/L (ref 98–107)
Co2: 31 mmol/L (ref 21–32)
Creatinine: 0.79 mg/dL (ref 0.60–1.30)
EGFR (African American): >60
EGFR (Non-African Amer.): >60
Glucose: 108 mg/dL — ABNORMAL HIGH (ref 65–99)
Osmolality: 279 (ref 275–301)
Potassium: 3.2 mmol/L — ABNORMAL LOW (ref 3.5–5.1)
Sodium: 140 mmol/L (ref 136–145)

## 2013-04-08 LAB — CBC WITH DIFFERENTIAL/PLATELET
Basophil: 1 %
Comment - H1-Com3: NORMAL
Eosinophil: 3 %
HCT: 24.2 % — ABNORMAL LOW (ref 35.0–47.0)
Lymphocytes: 19 %
MCH: 28.5 pg (ref 26.0–34.0)
MCHC: 33.5 g/dL (ref 32.0–36.0)
Monocytes: 4 %
RDW: 16.9 % — ABNORMAL HIGH (ref 11.5–14.5)
Segmented Neutrophils: 73 %
WBC: 17.2 10*3/uL — ABNORMAL HIGH (ref 3.6–11.0)

## 2013-04-09 LAB — BASIC METABOLIC PANEL
Anion Gap: 7 (ref 7–16)
BUN: 9 mg/dL (ref 7–18)
Calcium, Total: 7.6 mg/dL — ABNORMAL LOW (ref 8.5–10.1)
Co2: 30 mmol/L (ref 21–32)
Creatinine: 0.71 mg/dL (ref 0.60–1.30)
EGFR (Non-African Amer.): >60
Glucose: 115 mg/dL — ABNORMAL HIGH (ref 65–99)
Potassium: 3.5 mmol/L (ref 3.5–5.1)
Sodium: 139 mmol/L (ref 136–145)

## 2013-04-09 LAB — MAGNESIUM: Magnesium: 1.8 mg/dL

## 2013-04-09 LAB — CBC WITH DIFFERENTIAL/PLATELET
Eosinophil #: 0.3 10*3/uL (ref 0.0–0.7)
Eosinophil %: 2.2 %
HCT: 23.5 % — ABNORMAL LOW (ref 35.0–47.0)
HGB: 7.9 g/dL — ABNORMAL LOW (ref 12.0–16.0)
Lymphocyte #: 1.3 10*3/uL (ref 1.0–3.6)
MCV: 85 fL (ref 80–100)
Monocyte #: 1 x10 3/mm — ABNORMAL HIGH (ref 0.2–0.9)
Neutrophil #: 10.5 10*3/uL — ABNORMAL HIGH (ref 1.4–6.5)
Neutrophil %: 80.2 %
Platelet: 358 10*3/uL (ref 150–440)

## 2013-04-11 LAB — HEMOGLOBIN: HGB: 7.9 g/dL — ABNORMAL LOW (ref 12.0–16.0)

## 2013-05-17 ENCOUNTER — Ambulatory Visit: Payer: Self-pay | Admitting: Surgery

## 2013-05-17 LAB — CBC WITH DIFFERENTIAL/PLATELET
BASOS ABS: 0.1 10*3/uL (ref 0.0–0.1)
Basophil %: 0.9 %
Eosinophil #: 0.2 10*3/uL (ref 0.0–0.7)
Eosinophil %: 2.3 %
HCT: 36.1 % (ref 35.0–47.0)
HGB: 11.8 g/dL — AB (ref 12.0–16.0)
LYMPHS ABS: 1.6 10*3/uL (ref 1.0–3.6)
LYMPHS PCT: 16.2 %
MCH: 25.8 pg — ABNORMAL LOW (ref 26.0–34.0)
MCHC: 32.7 g/dL (ref 32.0–36.0)
MCV: 79 fL — AB (ref 80–100)
Monocyte #: 0.5 x10 3/mm (ref 0.2–0.9)
Monocyte %: 5.4 %
NEUTROS ABS: 7.4 10*3/uL — AB (ref 1.4–6.5)
NEUTROS PCT: 75.2 %
Platelet: 365 10*3/uL (ref 150–440)
RBC: 4.57 10*6/uL (ref 3.80–5.20)
RDW: 16.2 % — AB (ref 11.5–14.5)
WBC: 9.9 10*3/uL (ref 3.6–11.0)

## 2013-05-17 LAB — BASIC METABOLIC PANEL
Anion Gap: 4 — ABNORMAL LOW (ref 7–16)
BUN: 9 mg/dL (ref 7–18)
CALCIUM: 9.2 mg/dL (ref 8.5–10.1)
CO2: 31 mmol/L (ref 21–32)
Chloride: 101 mmol/L (ref 98–107)
Creatinine: 0.68 mg/dL (ref 0.60–1.30)
EGFR (African American): >60
EGFR (Non-African Amer.): >60
GLUCOSE: 171 mg/dL — AB (ref 65–99)
OSMOLALITY: 275 (ref 275–301)
Potassium: 3.1 mmol/L — ABNORMAL LOW (ref 3.5–5.1)
SODIUM: 136 mmol/L (ref 136–145)

## 2013-06-02 ENCOUNTER — Ambulatory Visit: Payer: Self-pay | Admitting: Surgery

## 2013-08-23 ENCOUNTER — Ambulatory Visit: Payer: Self-pay | Admitting: Oncology

## 2013-08-23 LAB — COMPREHENSIVE METABOLIC PANEL
ALBUMIN: 3.8 g/dL (ref 3.4–5.0)
ALT: 23 U/L (ref 12–78)
Alkaline Phosphatase: 124 U/L — ABNORMAL HIGH
Anion Gap: 8 (ref 7–16)
BILIRUBIN TOTAL: 0.3 mg/dL (ref 0.2–1.0)
BUN: 13 mg/dL (ref 7–18)
Calcium, Total: 9 mg/dL (ref 8.5–10.1)
Chloride: 101 mmol/L (ref 98–107)
Co2: 32 mmol/L (ref 21–32)
Creatinine: 0.94 mg/dL (ref 0.60–1.30)
GLUCOSE: 110 mg/dL — AB (ref 65–99)
OSMOLALITY: 282 (ref 275–301)
Potassium: 3.9 mmol/L (ref 3.5–5.1)
SGOT(AST): 11 U/L — ABNORMAL LOW (ref 15–37)
Sodium: 141 mmol/L (ref 136–145)
Total Protein: 7.6 g/dL (ref 6.4–8.2)

## 2013-08-23 LAB — CBC CANCER CENTER
BASOS ABS: 0.1 x10 3/mm (ref 0.0–0.1)
Basophil %: 0.8 %
Eosinophil #: 0.2 x10 3/mm (ref 0.0–0.7)
Eosinophil %: 1.7 %
HCT: 42 % (ref 35.0–47.0)
HGB: 13.2 g/dL (ref 12.0–16.0)
LYMPHS ABS: 2.6 x10 3/mm (ref 1.0–3.6)
Lymphocyte %: 23.4 %
MCH: 22.8 pg — ABNORMAL LOW (ref 26.0–34.0)
MCHC: 31.4 g/dL — AB (ref 32.0–36.0)
MCV: 73 fL — ABNORMAL LOW (ref 80–100)
MONOS PCT: 7.4 %
Monocyte #: 0.8 x10 3/mm (ref 0.2–0.9)
Neutrophil #: 7.5 x10 3/mm — ABNORMAL HIGH (ref 1.4–6.5)
Neutrophil %: 66.7 %
PLATELETS: 296 x10 3/mm (ref 150–440)
RBC: 5.79 10*6/uL — AB (ref 3.80–5.20)
RDW: 18.4 % — AB (ref 11.5–14.5)
WBC: 11.2 x10 3/mm — AB (ref 3.6–11.0)

## 2013-09-02 ENCOUNTER — Ambulatory Visit: Payer: Self-pay | Admitting: Oncology

## 2013-12-13 ENCOUNTER — Ambulatory Visit: Payer: Self-pay | Admitting: Urgent Care

## 2013-12-13 LAB — COMPREHENSIVE METABOLIC PANEL
ALBUMIN: 3.8 g/dL (ref 3.4–5.0)
ALT: 26 U/L
ANION GAP: 8 (ref 7–16)
Alkaline Phosphatase: 130 U/L — ABNORMAL HIGH
BUN: 10 mg/dL (ref 7–18)
Bilirubin,Total: 0.4 mg/dL (ref 0.2–1.0)
CHLORIDE: 103 mmol/L (ref 98–107)
CREATININE: 0.91 mg/dL (ref 0.60–1.30)
Calcium, Total: 8.9 mg/dL (ref 8.5–10.1)
Co2: 30 mmol/L (ref 21–32)
EGFR (African American): >60
EGFR (Non-African Amer.): >60
GLUCOSE: 76 mg/dL (ref 65–99)
OSMOLALITY: 279 (ref 275–301)
POTASSIUM: 3.5 mmol/L (ref 3.5–5.1)
SGOT(AST): 24 U/L (ref 15–37)
Sodium: 141 mmol/L (ref 136–145)
Total Protein: 8.2 g/dL (ref 6.4–8.2)

## 2013-12-13 LAB — CBC WITH DIFFERENTIAL/PLATELET
BASOS ABS: 0.1 10*3/uL (ref 0.0–0.1)
Basophil %: 0.8 %
EOS ABS: 0.2 10*3/uL (ref 0.0–0.7)
Eosinophil %: 1.3 %
HCT: 42 % (ref 35.0–47.0)
HGB: 13.4 g/dL (ref 12.0–16.0)
LYMPHS ABS: 4 10*3/uL — AB (ref 1.0–3.6)
Lymphocyte %: 29 %
MCH: 24.1 pg — AB (ref 26.0–34.0)
MCHC: 32 g/dL (ref 32.0–36.0)
MCV: 76 fL — AB (ref 80–100)
MONOS PCT: 7.6 %
Monocyte #: 1 x10 3/mm — ABNORMAL HIGH (ref 0.2–0.9)
Neutrophil #: 8.4 10*3/uL — ABNORMAL HIGH (ref 1.4–6.5)
Neutrophil %: 61.3 %
Platelet: 368 10*3/uL (ref 150–440)
RBC: 5.56 10*6/uL — ABNORMAL HIGH (ref 3.80–5.20)
RDW: 17.9 % — AB (ref 11.5–14.5)
WBC: 13.8 10*3/uL — ABNORMAL HIGH (ref 3.6–11.0)

## 2013-12-13 LAB — LIPASE, BLOOD: Lipase: 153 U/L (ref 73–393)

## 2013-12-15 ENCOUNTER — Ambulatory Visit: Payer: Self-pay | Admitting: Urgent Care

## 2013-12-16 ENCOUNTER — Other Ambulatory Visit: Payer: Self-pay | Admitting: Urgent Care

## 2013-12-16 LAB — CBC WITH DIFFERENTIAL/PLATELET
BASOS ABS: 0.1 10*3/uL (ref 0.0–0.1)
BASOS PCT: 0.6 %
EOS PCT: 1.3 %
Eosinophil #: 0.1 10*3/uL (ref 0.0–0.7)
HCT: 41.1 % (ref 35.0–47.0)
HGB: 13 g/dL (ref 12.0–16.0)
Lymphocyte #: 1.7 10*3/uL (ref 1.0–3.6)
Lymphocyte %: 16.8 %
MCH: 24 pg — ABNORMAL LOW (ref 26.0–34.0)
MCHC: 31.6 g/dL — ABNORMAL LOW (ref 32.0–36.0)
MCV: 76 fL — AB (ref 80–100)
MONO ABS: 0.3 x10 3/mm (ref 0.2–0.9)
Monocyte %: 2.8 %
NEUTROS ABS: 8 10*3/uL — AB (ref 1.4–6.5)
Neutrophil %: 78.5 %
PLATELETS: 313 10*3/uL (ref 150–440)
RBC: 5.43 10*6/uL — AB (ref 3.80–5.20)
RDW: 17.8 % — AB (ref 11.5–14.5)
WBC: 10.2 10*3/uL (ref 3.6–11.0)

## 2013-12-23 ENCOUNTER — Ambulatory Visit: Payer: Self-pay | Admitting: Family Medicine

## 2014-01-26 ENCOUNTER — Ambulatory Visit: Payer: Self-pay | Admitting: Oncology

## 2014-03-07 ENCOUNTER — Ambulatory Visit: Payer: Self-pay | Admitting: Oncology

## 2014-03-07 LAB — CBC CANCER CENTER
Basophil #: 0.1 x10 3/mm (ref 0.0–0.1)
Basophil %: 0.5 %
Eosinophil #: 0.1 x10 3/mm (ref 0.0–0.7)
Eosinophil %: 1.2 %
HCT: 41.2 % (ref 35.0–47.0)
HGB: 13 g/dL (ref 12.0–16.0)
Lymphocyte #: 2.5 x10 3/mm (ref 1.0–3.6)
Lymphocyte %: 20.8 %
MCH: 24.4 pg — AB (ref 26.0–34.0)
MCHC: 31.4 g/dL — ABNORMAL LOW (ref 32.0–36.0)
MCV: 78 fL — ABNORMAL LOW (ref 80–100)
MONO ABS: 0.8 x10 3/mm (ref 0.2–0.9)
Monocyte %: 6.5 %
Neutrophil #: 8.6 x10 3/mm — ABNORMAL HIGH (ref 1.4–6.5)
Neutrophil %: 71 %
Platelet: 273 x10 3/mm (ref 150–440)
RBC: 5.32 10*6/uL — AB (ref 3.80–5.20)
RDW: 18.7 % — AB (ref 11.5–14.5)
WBC: 12.1 x10 3/mm — ABNORMAL HIGH (ref 3.6–11.0)

## 2014-03-07 LAB — CREATININE, SERUM: Creatine, Serum: 0.92

## 2014-03-07 LAB — COMPREHENSIVE METABOLIC PANEL
ALBUMIN: 3.9 g/dL (ref 3.4–5.0)
ALT: 29 U/L
AST: 17 U/L (ref 15–37)
Alkaline Phosphatase: 113 U/L
Anion Gap: 7 (ref 7–16)
BUN: 13 mg/dL (ref 7–18)
Bilirubin,Total: 0.4 mg/dL (ref 0.2–1.0)
CALCIUM: 9.1 mg/dL (ref 8.5–10.1)
CO2: 28 mmol/L (ref 21–32)
CREATININE: 0.92 mg/dL (ref 0.60–1.30)
Chloride: 102 mmol/L (ref 98–107)
EGFR (African American): >60
Glucose: 109 mg/dL — ABNORMAL HIGH (ref 65–99)
Osmolality: 275 (ref 275–301)
Potassium: 4 mmol/L (ref 3.5–5.1)
Sodium: 137 mmol/L (ref 136–145)
Total Protein: 7.5 g/dL (ref 6.4–8.2)

## 2014-04-04 ENCOUNTER — Ambulatory Visit: Payer: Self-pay | Admitting: Oncology

## 2014-04-05 ENCOUNTER — Ambulatory Visit: Payer: Self-pay | Admitting: Family Medicine

## 2014-08-25 NOTE — Op Note (Signed)
PATIENT NAME:  APREL, EGELHOFF MR#:  [id number] DATE OF BIRTH:  [birth year]  DATE OF PROCEDURE:  04/03/2013  OPERATION PERFORMED: Debridement of subcutaneous tissue and fascia.   PREOPERATIVE DIAGNOSIS: Necrotizing soft tissue infection.   POSTOPERATIVE DIAGNOSIS:  Necrotizing soft tissue infection, mostly fat with a little fascia.   SURGEON: Consuela Mimes, M.D.   ANESTHESIA: General.   PROCEDURE IN DETAIL: The patient was placed supine on the Operating Room table and prepped and draped in the usual sterile fashion. All of the staples were removed and the patient was noted to have a classic necrotizing soft tissue infection with extremely foul-smelling, very watery, blackish fluid. There were areas of subcutaneous tissue that were viable but there were also areas that were nonviable. There areas that were nonviable, for the most part, only had a depth of a centimeter or less but there were a few tracks that were a couple of centimeters long. There was some dead fascia but, for the most part, the fascia looked healthy. The debridement was undertaken sharply with the electrocautery and also bluntly with Russian forceps. All of the necrotic tissue was debrided. The suture line was intact. The hemostasis was achieved with electrocautery and then the wound was irrigated with copious amounts of serial hydrogen peroxide and, at the conclusion of the irrigation, all of the remaining tissue was quite viable. A large black VAC sponge was placed in the wound and the remainder of the VAC appliance was applied and connected to 100 cm of water continuous suction with minimal leak. The patient tolerated the procedure well. There were no complications.   ____________________________ Consuela Mimes, MD wfm:cs D: 04/03/2013 12:55:11 ET T: 04/03/2013 19:50:52 ET JOB#: [id number]  cc: Consuela Mimes, MD, <Dictator> Consuela Mimes MD ELECTRONICALLY SIGNED 04/11/2013 23:34

## 2014-08-25 NOTE — Op Note (Signed)
PATIENT NAME:  Amy Buchanan, Amy Buchanan MR#:  [id number] DATE OF BIRTH:  17-Sep-1955  DATE OF PROCEDURE:  03/29/2013  PREOPERATIVE DIAGNOSIS: Massive upper gastrointestinal hemorrhage.   POSTOPERATIVE DIAGNOSES: Massive upper gastrointestinal hemorrhage, gastric ulcer disease, multiple gastric polyps.   PROCEDURES PERFORMED:  1. Exploratory laparotomy.  2. Gastrotomy.  3. Oversew of gastric ulcer along the lesser curvature.  4. Wedge excision of portion of greater curvature of the stomach.   SURGEON: Sherri Rad, M.D. FACS  ASSISTANT: Surgical scrub technologist.   TYPE OF ANESTHESIA: [facility], general oroendotracheal.   FINDINGS: 1.5 cm bleeding lesser curvature ulcer, multiple gastrec polyps. pedunculated antral polyp.  SPECIMENS: Portion of the stomach with contained polyp off the greater curvature near antrum   DESCRIPTION OF PROCEDURE: With informed consent, supine position, general oroendotracheal anesthesia was induced. The patient's abdomen was widely prepped and draped with ChloraPrep solution. Timeout was observed.   The abdomen was entered through a midline incision from the xiphoid down to the umbilicus. A self-retaining abdominal retractor was placed. Musculofascial layers being divided with electrocautery. The stomach was markedly distended, and there was evidence of blood within the small intestine. The stomach was palpated. There was no obvious mass. A trasnverse gastrotomy was fashioned at the midportion of the anterior wall of the stomach after the omentum was reflected off the portion of the greater curvature utilizing the LigaSure apparatus allowed entrance into the lesser sac. A large amount of clot with particulate matter was evacuated. This was totaling at least 1 to 1.5 liters. Irrigation of the gastric contents and mucosa demonstrated a hemoclip seen at the gastroesophageal junction placed earlier this evening. This did not appear to be bleeding. Along the  greater curvature was a small polyp which was hyperemic in nature. This was oversewn with several #0 silk sutures in figure-of-eight orientation. Directly opposite this along the greater curvature was a 1.5 cm gastric ulcer with heaping of the mucosa and some exudate on top of it which appeared to be the source of bleeding. Distal to the gastrotomy, there was a palpable pedunculated polyp along the greater curvature. Following the ulcer being oversewn with several figure-of-eight #0 Vicryl sutures and hemostasis being ensured on the operative field in that location, attention was then turned to the greater curvature of the stomach. A small gastrotomy was fashioned directly over the polyp. This was a long pedunculated polyp which appeared to not have any signs of active bleeding. Wedge resection was then achieved utilizing multiple fires of the GIA 75 stapler. Nasogastric tube was replaced by anesthesia and confirmed within the body of the stomach by palpation. Gastrotomy was then closed utilizing 2 fires of the TA-60 stapler with thick load. All staple lines were reinforced with seromuscular 2-0 silk suture. The abdomen at this point was copiously irrigated and aspirated dry. Hemostasis being ensured on the operative field with point cautery and subcutaneous tissues and lap and needle count correct x 2, the fascia was closed in the extremes of the wound with running #1 looped PDS. Subcutaneous tissues were irrigated. Change of gown and gloves was performed prior to fascial closure. Skin edges were reapproximated utilizing a skin stapler. The patient was then subsequently transferred to the intensive care unit in stable and satisfactory condition by anesthesia services.   ____________________________ Jeannette How Marina Gravel, MD FACS mab:gb D: 03/29/2013 21:25:06 ET T: 03/29/2013 22:07:31 ET JOB#: [id number]  cc: Elta Guadeloupe A. Marina Gravel, MD, <Dictator> Derryck Shahan A Laval Cafaro MD ELECTRONICALLY SIGNED 03/31/2013 20:19

## 2014-08-25 NOTE — Op Note (Signed)
PATIENT NAME:  Amy Buchanan, Amy Buchanan MR#:  [id number] DATE OF BIRTH:  [birth year]  DATE OF PROCEDURE:  04/06/2013  POSTOPERATIVE DIAGNOSIS: Wound dehiscence.   POSTOPERATIVE DIAGNOSIS: Wound dehiscence.   PROCEDURE: Wound VAC change.   SURGEON: Phoebe Perch, M.D.   ANESTHESIA: General with endotracheal tube.   ASSISTANT: Blima Ledger, PA-S  INDICATIONS: This is a patient with a wound dehiscence. She is here for wound VAC change. Preoperatively, we discussed the rationale for this procedure and the options of observation or alternative therapies. She understood and agreed to proceed.   FINDINGS: Some purulence and necrotic fascia. Cultures were taken and purulence was wiped clean.   DESCRIPTION OF PROCEDURE: The patient was induced to general anesthesia. She was on IV antibiotics. VTE prophylaxis was in place. She was then prepped. No drapes were utilized (the wound VAC had been removed prior to prepping, obviously).   The wound was inspected. Some purulence was noted which was cultured. This was wiped clean with a laparotomy pad, and then the wound was further inspected. Some necrotic fascia was identified. No debridement was performed. The wound VAC was tailored and placed utilizing black foam, and then in a standard fashion, the wound VAC was placed and placed to suction with a low leak rate.   The patient tolerated this procedure well. There were no complications. She was taken to the recovery room in stable condition to be admitted for continued care.   ____________________________ Jerrol Banana. Burt Knack, MD rec:np D: 04/06/2013 14:18:06 ET T: 04/06/2013 14:59:43 ET JOB#: [id number]  cc: Jerrol Banana. Burt Knack, MD, <Dictator> Florene Glen MD ELECTRONICALLY SIGNED 04/06/2013 18:07

## 2014-08-25 NOTE — Consult Note (Signed)
PATIENT NAME:  Amy, Buchanan MR#:  [id number] DATE OF BIRTH:  09/05/1955  DATE OF CONSULTATION:  03/28/2013  REFERRING PHYSICIAN:   CONSULTING PHYSICIAN:  Yareth Macdonnell A. Marina Gravel, MD  HISTORY OF PRESENT ILLNESS: This 59 year old obese white female is being admitted to the medical Intensive Care Unit on the [facility] medical service with acute onset of bloody emesis starting at 5:00 this afternoon. The patient states that ever since she had her chemotherapy for stage II breast cancer, she has had daily emesis. She denies any prodrome of abdominal pain or back pain. She had no change in her daily emesis this morning. She states that she had a small amount of bloody emesis at noon, before lunch on Monday. At 5:00 today, she had the sudden onset of a large amount of bloody emesis and sought medical attention in the Emergency Room.   While in the Emergency Room, the patient was found to have a heart rate of 86, blood pressure of 147/96. Her initial hemoglobin was 12.6, platelet count of 282,000. She received 2 units of blood between the Emergency Room and the endoscopy suite. Early endoscopy performed by Dr. Lucilla Lame was performed. He described a large amount of clotted blood seen within the fundus and body of the stomach. There are multiple small gastric polyps, one of which, at gastroesophageal junction, was Hemoclipped for a small amount of bleeding. The duodenum was normal. The lower esophagus had old blood in it. There was no obvious source of bleeding because of the large amount of clot. While in the endoscopy suite, the patient had a large melanotic stool in addition to a large amount of bloody emesis.  She remained hemodynamically stable, despite a general anesthesia. A  nasogastric tube was unable to be placed in the Emergency Room but was successfully placed by Dr. Allen Norris in the endoscopy suite. It is draining dark bloody fluid. It is currently being lavaged in the PACU. Dr. Allen Norris contacted me personally  regarding the patient's care. I reviewed the endoscopy findings with him in person. The patient is under the care, in the past, by both Dr. Rochel Brome and Dr. Hervey Ard. I discussed with the patient her choice of surgeon, as well as with her,  husband and they would proceed with the doctor who is on call tonight.   While at endoscopy, the patient had approximately 700 to 800 mL of fluid aspirated from her stomach. Multiple attempts at lavage were unsuccessful while endoscopy. While in the PACU, a nasogastric tube has been lavaged with 60 mL of room-temperature normal saline and a total of 250 mL was seen in the canister. The patient also had a large amount of emesis while in the endoscopy suite.   The patient is alert and oriented during her interview. At present, she is complaining of the need for defecation. No further melanotic stools have been noted while in the recovery room.   PAST MEDICAL HISTORY: Breast Cancer, Obesity, CHF  past Surgical History:  hysterectomy, history of open hemorrhoidectomy and lateral internal sphincterotomy, breast surgery.  REVIEW OF SYSTEMS: As described above.   FAMILY HISTORY: Noncontributory.   SOCIAL HISTORY: Negative.   PHYSICAL EXAMINATION: VITAL SIGNS:  At present, heart rate 96, blood pressure 127/60.  GENERAL: The patient is alert and oriented, cooperative. Nasogastric tube is in place. The patient is morbidly obese.  LUNGS: Clear.  HEART: Regular rate and rhythm.  ABDOMEN: Soft, obese, nontender. Multiple scars are present.  RECTAL: Deferred.  EXTREMITIES: Warm  and well perfused.  GU deferred.  LABORATORY VALUES: As described above. Repeat CBC and electrolytes are pending.   IMPRESSION: Significant upper gastrointestinal bleed, unclear source, suspect peptic ulcer disease versus bleeding polyp.   PLAN: ICU transfer, serial abdominal examinations, close monitoring of her vital signs as well as her hemoglobin. Continue to lavage her  stomach and, if she is stable, with hemoglobin drops would further benefit from a repeat upper endoscopy with endoscopic therapeutic intervention, although given amount of blood in her stomach this may be difficult.  I have asked for an active type and cross of 4 units to be made available. At present, I see no clear indication for urgent surgical intervention at this time. This would include a gastrotomy with exploration of the stomach, evacuation of the clot, and either oversew of the bleeding ulcer or polyp with possible wedge resection, or more extensive  gastrectomy based on the location and nature of the operative findings. I discussed with the patient and her husband morbidity of this procedure, given her morbid obesity and her concurrent medical issues.   TOTAL TIME SPENT: 75 minutes   ____________________________ Jeannette How. Marina Gravel, MD FACS mab:cg D: 03/28/2013 23:42:30 ET T: 03/29/2013 03:12:49 ET JOB#: [id number]  cc: Elta Guadeloupe A. Marina Gravel, MD, <Dictator> Belmont Valli A Marcille Barman MD ELECTRONICALLY SIGNED 03/29/2013 21:25

## 2014-08-25 NOTE — Op Note (Signed)
PATIENT NAME:  Amy Buchanan, Amy Buchanan MR#:  [id number] DATE OF BIRTH:  [birth year]  DATE OF PROCEDURE:  04/12/2013  PREOPERATIVE DIAGNOSIS: Slowly healing abdominal wound status post reopening for postoperative wound infection.   POSTOPERATIVE DIAGNOSIS:  Slowly healing abdominal wound status post reopening for postoperative wound infection.  PROCEDURE PERFORMED:  Wound VAC change under anesthesia.   ESTIMATED BLOOD LOSS:  5 mL.   COMPLICATIONS: None.   SPECIMENS: None.   ANESTHESIA: General endotracheal.  INDICATION FOR SURGERY:  Amy Buchanan is a pleasant 59 year old who was recently admitted for a GI bleed which required laparotomy, gastrotomy and oversewing of bleeding ulcer. She had a postoperative severe wound infection, which required return to the OR. She has thus been undergoing periodic OR wound VAC changes and debridement.  DETAILS OF PROCEDURE: Amy Buchanan was brought to the Operating Room suite. She was laid supine on the Operating Room table where her abdomen was prepped and draped in surgical fashion. A timeout was then performed correctly identifying the patient name, operative site and procedure performed. The VAC was taken down. The wound was evaluated. There was good granulation tissue over what I thought was granulating bowel. Fascial edges were palpable and there appeared to be maybe a 10 x 4 cm defect entered some cavities laterally, which appeared to have some purulence. I then irrigated the entire abdomen and placed the wound VAC apparatus, as I did not want granulating bowel in direct contact with wound VAC sponge. I then place 3 pieces of Adaptic gauze over the bowel. I then placed the sponge in the suction apparatus. There was good seal. The patient was then awoken, extubated and brought to the postanesthesia care unit. There were no immediate complications. Needle, sponge and instrument counts were correct at the end of the procedure.     ____________________________ Glena Norfolk. Tyshawna Alarid, MD cal:ce D: 04/12/2013 16:48:18 ET T: 04/12/2013 18:43:46 ET JOB#: [id number]  cc: Harrell Gave A. Kamdon Reisig, MD, <Dictator> Floyde Parkins MD ELECTRONICALLY SIGNED 04/14/2013 11:44

## 2014-08-25 NOTE — Consult Note (Signed)
Brief Consult Note: Diagnosis: Upper GI bleed.   Patient was seen by consultant.   Comments: The patient had an emergent upper endoscopy for hematemesis last evening. There was a large amount of blood seen in the stomach. Spoke to family about likeyhood of surgical need.  Electronic Signatures: Lucilla Lame (MD)  (Signed [phone] 08:02)  Authored: Brief Consult Note   Last Updated: 25-Nov-14 08:02 by Lucilla Lame (MD)

## 2014-08-25 NOTE — Op Note (Signed)
PATIENT NAME:  Amy Buchanan, Amy Buchanan MR#:  [id number] DATE OF BIRTH:  May 15, 1955  DATE OF PROCEDURE:  04/09/2013  PREOPERATIVE DIAGNOSIS: Abdominal wound dehiscence.   POSTOPERATIVE DIAGNOSIS: Abdominal wound dehiscence.   PROCEDURE:  Wound VAC change.   SURGEON: Phoebe Perch, M.D.   ANESTHESIA: General with endotracheal tube.   INDICATIONS: This is a patient with a large complex abdominal wound requiring wound VAC change. Preoperatively, we discussed rationale for this procedure and the options of observation, risks of bleeding, infection, recurrence, and the need for further wound VAC change. This was reviewed for her. She understood and agreed to proceed.   FINDINGS: Much improved wound with minimal necrosis. No purulence and much better granulation tissue.   DESCRIPTION OF PROCEDURE: The patient was induced to general anesthesia, prepped in a sterile fashion. The wound was inspected. Minimal necrosis was identified over the bowel. It was not debrided. No purulence was noted. A piece of silver-type sponge was tailored to fit the wound and the wound VAC adhesive dressing was placed in the standard fashion.  Adequate suction was obtained and the patient was then taken to the recovery room in stable condition to be admitted for continued care.     ____________________________ Jerrol Banana. Burt Knack, MD rec:dp D: 04/09/2013 08:41:51 ET T: 04/09/2013 08:58:51 ET JOB#: [id number]  cc: Jerrol Banana. Burt Knack, MD, <Dictator> Florene Glen MD ELECTRONICALLY SIGNED 04/09/2013 14:38

## 2014-08-25 NOTE — Discharge Summary (Signed)
PATIENT NAME:  Amy Buchanan, Amy Buchanan MR#:  [id number] DATE OF BIRTH:  Sep 16, 1955  DATE OF ADMISSION:  03/28/2013 DATE OF DISCHARGE:  04/15/2013  FINAL DIAGNOSES:  1.  Massive upper gastrointestinal hemorrhage.  2.  Massive obesity.  3.  Necrotizing soft tissue infection.   PRINCIPAL PROCEDURES:  1.  Upper endoscopy.  2.  Exploratory gastrotomy with oversew of gastric ulcer.   HOSPITAL COURSE SUMMARY: The patient was admitted with massive upper GI hemorrhage. Upper endoscopy failed to demonstrate the site of bleeding. The patient remained unstable in the intensive care unit, was taken to the operating room urgently in the middle of the night, on the 24th of November, at which point a gastrotomy was performed and oversew of an ulcer was achieved. The patient did require blood transfusion postoperatively, of multiple units. She was found to have a significant wound infection, on her abdominal wall, on the 30th of November at which point she was taken back to the operating room by Dr. Leanora Cover. Debridement of soft tissue and fat was performed. She then underwent another operation by Dr. Burt Knack on the 3rd of December where there was wound dehiscence debrided further and a VAC was placed. The patient continued to improve. She did require 2 more units of blood at this point. She had no further sites of GI bleeding. On the 6th of December she was taken back again for wound VAC change. The wound continued to granulate. She was deemed suitable for discharge following another trip to the operating room on the 9th of December at which point wound VAC was changed under anesthesia. The wound was healthy and granulating. She was discharged home in stable condition with followup in my office in 1 weeks' time, on the 12th of December. Discharge medications can be found on the reconciliation form.  ____________________________ Jeannette How. Marina Gravel, MD mab:sb D: 04/29/2013 11:32:24 ET T: 04/29/2013 12:13:40  ET JOB#: [id number]  cc: Elta Guadeloupe A. Marina Gravel, MD, <Dictator> Hortencia Conradi MD ELECTRONICALLY SIGNED 04/30/2013 11:14

## 2014-08-25 NOTE — Op Note (Signed)
PATIENT NAME:  Amy Buchanan, Amy Buchanan MR#:  [id number] DATE OF BIRTH:  Feb 27, 1956  DATE OF PROCEDURE:  04/14/2013  PREOPERATIVE DIAGNOSIS: Open abdominal wound.   POSTOPERATIVE DIAGNOSIS: Open abdominal wound.   PROCEDURE PERFORMED: Wound VAC change.   SURGEON: Marlyce Huge, MD  ESTIMATED BLOOD LOSS: 5 mL.   COMPLICATIONS: None.   SPECIMENS: None.   ANESTHESIA: Norco, oral.   INDICATION FOR PROCEDURE: [name] is a pleasant 59 year old who underwent exploratory laparotomy, gastrotomy, and oversewing of bleeding gastric ulcer. She ended up having a postoperative wound necrosis, which had been debrided and now is requiring negative pressure wound therapy.   DETAILS OF PROCEDURE: Informed consent was obtained. [name] was given p.o. pain meds. Her wound VAC was then taken down. She appeared to have granulated bowel with minimal purulence. The Adaptic were then taken out. She had 3 pieces of Adaptic placed at the bottom of her wound, and the sponge was replaced. It was placed at negative 100 suction. There were no immediate complications. There was a good seal. Needle, sponge, and instrument counts were correct at the end of the procedure. ____________________________ Glena Norfolk. Layaan Mott, MD cal:sb D: 04/15/2013 07:06:51 ET T: 04/15/2013 07:25:02 ET JOB#: [id number]  cc: Harrell Gave A. Jiovany Scheffel, MD, <Dictator> Floyde Parkins MD ELECTRONICALLY SIGNED 04/17/2013 10:30

## 2014-08-25 NOTE — H&P (Signed)
PATIENT NAME:  Amy Buchanan, Amy Buchanan MR#:  [id number] DATE OF BIRTH:  [birth year]  DATE OF ADMISSION:  09/23/2012  PRIMARY CARE PHYSICIAN: Abby D. Benderm MD  PRIMARY ONCOLOGIST: Amy Gleason, MD    CHIEF COMPLAINT: "I am swollen, and I have a lot of fluid on me, and I am having shortness of breath."   HISTORY OF PRESENT ILLNESS: Amy Buchanan is 59 year old Caucasian female with history of hypertension, history of breast cancer being followed at the [facility] by Amy Buchanan, who is status post status post lumpectomy right breast, comes to the Emergency Room after she started having increasing shortness of breath. The patient's baseline weight is around 285 pounds. Today, she was weighed about 308 pounds. She has got significant bilateral lower extremity edema, has got abdominal tightness and has gained weight to the point she is getting short of breath, difficulty ambulating at home and unsteady gait. She denies any chest pain, however, does have PND and orthopnea. In the Emergency Room, the patient was found to be in congestive heart failure, new onset, suspect diastolic. Echo not done as of yet. The patient received IV Lasix, and she is being admitted for further evaluation and management.   PAST MEDICAL HISTORY: 1.  Stage II breast cancer, ER/PR positive, status post lumpectomy and currently on extended adjuvant  treatment with Aromasin.   2.  Hypertension.  3.  Gastroesophageal reflux disease:  4.  Type 2 diabetes.  5.  Gout.  6.  Bilateral tubal ligation.  7.  Partial hysterectomy.  8.  Depression.  FAMILY HISTORY: No history of colon cancer or breast cancer. History positive for diabetes and hypertension in the family.   SOCIAL HISTORY: Lives at home with husband. Does not smoke or drink alcohol.   ALLERGIES: HIVES TO CLOVES AND CITRUS FRUITS.   MEDICATIONS: 1.  Abilify 20 mg daily.   2.  Amlodipine 10 mg daily.  3.  Calcium with vitamin D 1 tablet b.i.d.  4.  Exemestane 25 mg daily.   5.  Ferrous sulfate 325 p.o. daily.  6.  Flonase 50 mcg/inhalation, 1 spray at lunch.  7.  Lasix 20 mg daily.  8.  Levothyroxine 88 mcg daily.  9.  Lisinopril 40 mg daily.  10.  Metformin extended release 500 mg daily.  11.  Metoprolol 25 mg b.i.d.  12.  Omeprazole 40 mg daily.  13.  Pataday 0.2% ophthalmic drops each eye affected as needed.  14.  Sertraline 100 mg daily.  15.  Zyrtec 10 mg daily.    REVIEW OF SYSTEMS:  CONSTITUTIONAL: No fever. Positive for fatigue, weakness and weight gain.  EYES: No blurred or double vision.  ENT: No tinnitus, ear pain, hearing loss.  RESPIRATORY: No cough, wheeze, hemoptysis.  CARDIOVASCULAR: No chest pain, orthopnea.  Positive for edema and shortness of breath.  GASTROINTESTINAL: No nausea, vomiting, diarrhea, abdominal pain. Positive for GERD and abdominal tightness.  GENITOURINARY: No dysuria or hematuria.  ENDOCRINE: No polyuria, nocturia or thyroid problems.  HEMATOLOGY: No anemia or easy bruising.  SKIN: No acne or rash.  MUSCULOSKELETAL: Positive for arthritis.  NEUROLOGICAL:  No CVA or TIA.  PSYCHIATRIC: Positive for depression.  All other systems reviewed and negative.   PHYSICAL EXAMINATION: GENERAL: The patient is awake, alert, oriented x 3, not in acute distress.  VITAL SIGNS: Her current weight is 308 pounds, afebrile, pulse is 78, blood pressure is 149/76. Sats are 96 percent on room 2 liters.  HEENT: Atraumatic, normocephalic. PERRLA. EOM intact.  Oral mucosa is moist.  NECK: Supple. No JVD. No carotid bruit.  RESPIRATORY: Has decreased breath sounds in the bases. No rales or rhonchi.  HEART: Both the heart sounds are normal. Rate, rhythm regular. No murmur heard. PMI not lateralized. Chest nontender.  EXTREMITIES: 3+ pitting edema in both the lower extremities up to the knee joints. Feeble pedal pulses secondary to masked by significant leg edema. I could not appreciate femoral pulse secondary to the patient's body habitus. She  is severely morbidly obese.  NEUROLOGICAL:  Grossly intact cranial nerves II through XII. No motor or sensory deficits.  PSYCHIATRIC: The patient is awake, alert oriented x 3.  SKIN: Warm and dry.   LABORATORY AND RADIOLOGICAL DATA:  Chest x-ray shows a right pleural effusion with findings suggestive of CHF.   B-type natriuretic peptide is 567, troponin  0.02. White count is 11.2,HCT 39.5. Platelet count is 281. Glucose is 100, rest of the chemistry is within normal limits. UA negative for UTI.  EKG shows low voltage QRS, normal sinus rhythm with arrhythmias.   ASSESSMENT AND PLAN: Amy Buchanan is a 59 year old with history of hypertension, GERD, type 2 diabetes, morbid obesity, comes in with increasing shortness of breath, weight gain and bilateral significant lower extremity edema, is being admitted with:   1.  New onset congestive heart failure:  I am suspecting she is diastolic in the setting of long-standing history of hypertension and obstructive sleep apnea.    A.  I will admit the patient to 2A.   B.  A 2 grams sodium ADA 1800 calorie diet.   C.  IV Lasix 20 q. 6 hourly, monitor I's and O's, daily weights.   D.  Continue lisinopril, beta blockers.   E.  We will obtain echo in the morning.   F.  Cardiology consultation, if needed.  G.  Cycle cardiac enzymes x 3.   H.  CHF education to the family and patient.  We will have a dietician consult placed.  2.  Hypertension: Continue lisinopril and beta blockers and metoprolol.  3.  Type 2 diabetes: Sliding scale insulin and metformin.  4.  Morbid obesity:  Weight reduction and exercise discussed.  5.  Iron deficiency anemia:  The patient is on ferrous sulfate, which we will continue.  6.  Depression: The patient is on sertraline, which I will continue along with Abilify.  7.  History of breast cancer:  Currently, the patient is on adjuvant chemo which is exemestane 25 mg p.o. daily.  8.  Deep vein thrombosis prophylaxis: With  heparin subcutaneous t.i.d.  9.  Further work-up according to the patient's clinical course.   Hospital admission is discussed with the patient and the patient's family members.   TIME SPENT: 50 minutes.  ____________________________ Hart Rochester Posey Pronto, MD sap:cb D: 09/23/2012 15:46:53 ET T: 09/23/2012 17:04:43 ET JOB#: [id number]  cc: Dante Cooter A. Posey Pronto, MD, <Dictator> Abby D. Benderm MD Ilda Basset MD ELECTRONICALLY SIGNED 09/30/2012 14:50

## 2014-08-25 NOTE — Discharge Summary (Signed)
PATIENT NAME:  Amy Buchanan, Amy Buchanan MR#:  [id number] DATE OF BIRTH:  1956/02/10  DATE OF ADMISSION:  09/23/2012 DATE OF DISCHARGE:  09/25/2012  For a detailed note, please take a look at the history and physical done on admission by Dr. Fritzi Mandes.   DIAGNOSES AT DISCHARGE:  Is as follows:  1.  Acute congestive heart failure, likely secondary to diastolic dysfunction.  2.  Obstructive sleep apnea.  3.  Morbid obesity.  4.  Diabetes.  5.  Hypertension.   6.  Hypothyroidism.   DIET:  The patient is being discharged on American Diabetes Association low sodium, low fat diet.   ACTIVITY:  As tolerated.   FOLLOW-UP:  is Dr. Rutherford Guys at [facility] in the next 1 to 2 weeks.   DISCHARGE MEDICATIONS:  Are as follows:  Omeprazole 40 mg daily, Abilify 20 mg daily, Synthroid 88 mcg daily, lisinopril 40 mg daily, Zoloft 100 mg daily, Norvasc 10 mg daily, Exemestane 25 mg daily, iron sulfate 325 mg daily, metformin 500 mg daily, calcium vitamin D 1 tab twice daily, metoprolol tartrate 25 mg twice daily, Zyrtec 10 mg daily, Flonase one spray to each nostril daily, Lasix 40 mg twice daily and potassium 10 mEq daily.   PERTINENT STUDIES DONE DURING THE HOSPITAL COURSE:  Are as follows:  A chest x-ray done on admission showing findings consistent with congestive heart failure and mild interstitial edema.  A 2-dimensional echocardiogram done showing an EF of 60% to 65%, mildly dilated left atrium, moderate left ventricular hypertrophy, mild tricuspid regurgitation.   HOSPITAL COURSE:  This is a 59 year old female with medical problems as mentioned above, presented to the hospital with shortness of breath and lower extremity edema, noted to be in congestive heart failure.  1.  Acute congestive heart failure.  This was likely the cause of patient's shortness of breath and lower extremity edema on admission.  The patient apparently was going to be started on diuretics by her primary care physician,  although had not increased the dose as she was feeling worsening shortness of breath.  She presented to the ER.  The chest x-ray findings were suggestive of pulmonary edema and CHF.  She was diuresed aggressively with IV Lasix here in the hospital and has clinically significantly improved.  She was ambulated on room air.  Did not qualify for home oxygen.  Presently, she is being discharged on a higher dose of Lasix and maintenance of her beta-blockers and ACE inhibitors as mentioned.  She did have an echocardiogram done, which showed an EF of 60% to 65%, therefore this was likely diastolic dysfunction secondary to hypertension and sleep apnea.  2.  History of obstructive sleep apnea.  The patient was maintained on her CPAP.  She will resume that.   3.  Diabetes.  The patient remained on her metformin while her in the hospital.  Sugars remained stable.  She will continue that upon discharge.  4.  Hypothyroidism.  The patient was maintained on her Synthroid.  She will resume that.  5.  Depression.  The patient was maintained on her Zoloft and Abilify and she will also resume that upon discharge.   CODE STATUS:  The patient is a FULL CODE.   DISPOSITION:  She is being discharged home in discharge.  Time spent on discharge is 35 minutes.    ____________________________ Belia Heman. Verdell Carmine, MD vjs:ea D: 09/25/2012 15:16:22 ET T: 09/26/2012 03:23:04 ET JOB#: [id number]  cc: Belia Heman. Verdell Carmine, MD, <Dictator>  Abby D. Rebeca Alert, MD  Henreitta Leber MD ELECTRONICALLY SIGNED 10/10/2012 20:25

## 2014-08-25 NOTE — H&P (Signed)
PATIENT NAME:  Amy Buchanan, Amy Buchanan MR#:  [id number] DATE OF BIRTH:  03-03-1956  DATE OF ADMISSION:  03/28/2013  REFERRING PHYSICIAN: Dr. Reita Cliche.  PRIMARY CARE PHYSICIAN: Dr. Rebeca Alert.   CHIEF COMPLAINT: Vomiting blood.  HISTORY OF PRESENT ILLNESS: This is a 59 year old Caucasian female with past medical history of hypertension, gastroesophageal reflux disease, congestive heart failure with preserved ejection fraction of 65%, as well as breast cancer, who is presenting with hematemesis. She has one day duration of acute onset hematemesis, occurred approximately at 5:00 p.m. 03/28/2013. She had one bout of a large volume hematemesis at home, then presented promptly to the Emergency Department. She denied any chest pain, palpitations, or shortness of breath. Of note, she has been having daily emesis secondary to chemotherapy. She noted some pink vomitus yesterday but was unconcerned. Otherwise, no recent changes. She also mentions having dark bowel movements with occasional bright red blood for approximately a week or more. She saw her PCP, and was found to be Hemoccult positive. She has been scheduled to undergo colonoscopy for work-up and evaluation. On arrival to the Emergency Department, she had four more bouts of bloody emesis. She was emergently taken to endoscopy, found to have one bleeding pedunculated polyp, with 2 nonbleeding sessile polyps. Underwent hemostasis with clipping of the polyp, and was subsequently transferred to the Intensive Care Unit. Currently, she is without complaint.   REVIEW OF SYSTEMS:  CONSTITUTIONAL: Denies fever, fatigue or weakness.  EYES: Denies blurred vision, double vision, eye pain.  EARS, NOSE, THROAT: Denies tinnitus, ear pain, hearing loss.  RESPIRATORY: Denies cough, wheeze, shortness of breath.  CARDIOVASCULAR: Denies chest pain, palpitations, edema.  GASTROINTESTINAL: Positive for nausea, vomiting, hematemesis, and bowel changes. Denies any abdominal pain.   GENITOURINARY: Denies dysuria or hematuria.  ENDOCRINE: Denies nocturia or polyuria.  HEMATOLOGIC AND LYMPHATIC: Denies easy bruising. Positive for bleeding as described above.  SKIN: Denies any rashes or lesions.  MUSCULOSKELETAL: Denies pain in her neck, back, shoulder, knees, hips or arthritic symptoms.  NEUROLOGIC: Denies any paralysis, paresthesias.  PSYCHIATRIC: Denies any anxiety or depressive symptoms.  Otherwise, full review of systems performed by me is negative.   PAST MEDICAL HISTORY: Congestive heart failure, preserved ejection fraction, schizophrenia, hypertension, gastroesophageal reflux disease, type 2 diabetes non-insulin-requiring, depression and hypothyroidism.   FAMILY HISTORY: Denies any coronary artery disease or gastrointestinal problems.   SOCIAL HISTORY: Denies any alcohol, tobacco or drug usage.   ALLERGIES: NO KNOWN DRUG ALLERGIES.   HOME MEDICATIONS: Abilify 20 mg p.o. daily, Norvasc 10 mg p.o. daily, calcium 600+ vitamin D 200 international units 1 tab by mouth twice daily, exemestane 25 mg p.o. daily, ferrous sulfate 325 mg p.o. daily, Flonase 50 mcg inhalation one nasal spray daily, Lasix 40 mg p.o. b.i.d., Synthroid 88 mcg p.o. daily, lisinopril 40 mg p.o. daily, metformin 500 mg extended release once daily, metoprolol 25 mg p.o. b.i.d., Prilosec 20 mg p.o. daily, Pataday 0.2% ophthalmic solution one drop to each eye daily as needed for allergies, potassium chloride 10 mEq p.o. daily, sertraline 100 mg p.o. daily, Zyrtec 10 mg p.o. at bedtime.   PHYSICAL EXAMINATION: VITAL SIGNS: Temperature 98.8 degrees Fahrenheit, heart rate 100, respirations 16, blood pressure 139/46, saturating 95% on room air. Weight 128.8 kg, BMI 51.2.  GENERAL: Well-nourished, well-developed, obese Caucasian female who is currently in no acute distress, being seen postoperatively in the Intensive Care Unit.  HEAD: Normocephalic, atraumatic.  EYES: Pupils equal, round, reactive to light.  Extraocular muscles intact. No scleral icterus.  MOUTH: Dry mucosal membranes. Dentition intact. No abscess noted.  EARS, NOSE, AND THROAT: Throat clear without exudates. No external lesions. NG tube in place.  NECK: Supple. No thyromegaly. No nodules. No JVD.  PULMONARY: Clear to auscultation bilaterally, without wheezes, rubs or rhonchi. Diminished breath sounds at the bases secondary to body habitus. No use of accessory muscles. Good respiratory effort.  CHEST: Nontender to palpation.  CARDIOVASCULAR: S1, S2, regular rate and rhythm. No murmurs, rubs, or gallops. No edema. Pedal pulses 2+ bilaterally.  GASTROINTESTINAL: Soft, nontender, nondistended. No masses. No hepatosplenomegaly. Positive bowel sounds, obese.  MUSCULOSKELETAL: No swelling, clubbing or edema. Range of motion full in all extremities.  NEUROLOGIC: Cranial nerves II through XII intact. No gross focal neurological deficit. Sensation intact. Reflexes intact.  SKIN: No ulcerations, lesions, rashes or cyanosis. Skin warm, dry, turgor intact.  PSYCHIATRIC: Mood and affect within normal limits. The patient is awake, alert, oriented x3. Insight and judgment intact.   LABORATORY DATA: Sodium 144, potassium 3.7, chloride 107, bicarbonate 26, BUN 16, creatinine 1.05, glucose 126, total protein 7.3, albumin 4.2. Total bilirubin 0.7, alkaline phosphatase 99, AST 10, ALT 19, troponin less than 0.02. WBC 17.2, hemoglobin 12.6, hematocrit 39.7, platelets of 282. INR 1.2, PTT 33.4.   ASSESSMENT AND PLAN: A 59 year old Caucasian female with past medical history of hypertension, gastroesophageal reflux disease, as well as breast cancer, presenting with hematemesis. 1.  Hematemesis. Gastroenterology and surgery input noted. We will follow serial CBCs, transfuse for hemoglobin less than 7. She has already received 2 units of packed red blood cells, per documentation, though there is no clear order that was in place. Protonix drip was ordered. She is  to remain n.p.o. and receive NG lavages.  2.  Hypertension. Hold oral agents in acute setting. Will follow her blood pressure. May likely need to restart her Norvasc and Lopressor.  3.  Gastroesophageal reflux disease. She will be on Protonix drip; will hold Prilosec.  4.  Type 2 diabetes. Will hold her oral agents. Add insulin sliding scale and q.6 hour Accu-Cheks.  5.  Schizophrenia. Continue with Abilify.  6.  Hypothyroidism. Continue Synthroid.  7.  Deep venous thrombosis prophylaxis with SCDs only in the setting of hematemesis.   CODE STATUS: The patient is full code.   TIME SPENT: 45 minutes.    ____________________________ Aaron Mose. Timiya Howells, MD dkh:cg D: 03/29/2013 00:28:00 ET T: 03/29/2013 01:41:00 ET JOB#: [id number]  cc: Aaron Mose. Tramon Crescenzo, MD, <Dictator> Gaelyn Tukes Woodfin Ganja MD ELECTRONICALLY SIGNED 03/29/2013 23:22

## 2014-08-26 NOTE — Op Note (Signed)
PATIENT NAME:  Amy, Buchanan MR#:  [id number] DATE OF BIRTH:  09/03/1955  DATE OF PROCEDURE:  06/02/2013  PREOPERATIVE DIAGNOSIS: Open abdominal wound.   POSTOPERATIVE DIAGNOSIS: Open abdominal wound, 100 sq cm.   PROCEDURE PERFORMED: Split thickness skin grafting 100 sq cm abdominal wound with initial placement of wound VAC.   SURGEON: Sherri Rad, M.D.   ASSISTANT: None.   ANESTHESIA: General endotracheal.   FINDINGS: There was a healthy granulation bed.   DESCRIPTION OF PROCEDURE: With informed consent, supine position, general endotracheal anesthesia, the patient's abdomen was prepped and draped with Betadine solution, right anterior thigh with Betadine solution. Timeout was observed. Utilizing a total of 250 mL of Pitkin solution consisting of normal saline with one ampule of epinephrine per 500 mL subdermal injection was created. Utilizing air the power Zimmer dermatome with 2 inch guard set at 18/1000th  of an inch. Two strips measuring approximately 16 cm were harvested. They were meshed on the back table, 1:3 ratioted mesh device. There were placed in sterile saline. The recipient site was then prepared by irrigating fibrinous material off the wound, gently scraping the granulation tissue to point bleeding. The mesh was then placed over this in three different pieces and securing it to the edges of the wound with 3-0 chromic suture. Mesh to mesh was secured with thin bites of 3-0 chromic. A large piece of Adaptic was placed with good overlap. Black pieces of granular foam were then created to fit the wound. Bioclusive film was placed, followed by tract pad and negative pressure was applied. The wound VAC was set at 81. Intention was for 125. The representative from wound VAC [employer] will be contacted to adjust this shortly. The patient tolerated the procedure well. Donor sites were covered with Xeroform followed by ABD and tape. The patient was then subsequently extubated and taken to the  recovery room in stable and satisfactory condition by anesthesia services.     ____________________________ Jeannette How Marina Gravel, MD mab:sg D: 06/02/2013 11:51:45 ET T: 06/02/2013 12:38:52 ET JOB#: [id number]  cc: Elta Guadeloupe A. Marina Gravel, MD, <Dictator> Hortencia Conradi MD ELECTRONICALLY SIGNED 06/06/2013 15:02

## 2014-08-27 NOTE — Op Note (Signed)
PATIENT NAME:  [name], [name] MR#:  [id number] DATE OF BIRTH:  10/26/1955  DATE OF PROCEDURE:  08/22/2011  PREOPERATIVE DIAGNOSIS: Rectal bleeding, hemorrhoids.  POSTOPERATIVE DIAGNOSIS: Rectal bleeding, hemorrhoids, with anterior anal fissure.   SURGEON: Hervey Ard, MD   ANESTHESIA: General by LMA under Dr. Andree Elk, Exparel 20 mL, 1.3% solution.   ESTIMATED BLOOD LOSS:  Minimal.  CLINICAL NOTE: This 59 year old woman has had rectal bleeding. Colonoscopy completed earlier in the day showed no pathology except for external hemorrhoids. She is brought to the operating room for exam under anesthesia and possible hemorrhoidectomy. She received Invanz prior to the procedure.   OPERATIVE NOTE: With the patient under adequate general anesthesia, supported on a beanbag and anchored in place, she underwent general anesthesia by LMA under Dr. Andree Elk. This was well tolerated. The right buttock was taped superiorly to allow exposure of the anus. After prep with Betadine solution 20 mL of Exparel was infiltrated for postoperative analgesia. Multiple external skin tag/hemorrhoids were excised with the use of the Harmonic scalpel. Excellent hemostasis was noted. She was found to have an anal fissure anteriorly. This was likely the source of her reported bright red blood per rectum. The anus was somewhat snug. It was elected to complete a lateral internal sphincterotomy at the 12 o'clock position (left lateral decubitus). The internal sphincter was isolated using gentle blunt dissection and then transected with the Harmonic scalpel. Digital exam showed marked relaxation of the anus. Anoscopy showed no violation of the anal rectal mucosa. A Vaseline gauze pack was placed for later removal in the recovery room.         The patient was returned to the supine position and tolerated the procedure well.    ____________________________ Robert Bellow, MD jwb:bjt D: 08/22/2011 18:36:47 ET T: 08/23/2011  11:20:15 ET JOB#: [id number]  cc: Robert Bellow, MD, <Dictator> Salome Holmes, MD Sahily Biddle Amedeo Kinsman MD ELECTRONICALLY SIGNED 08/26/2011 14:58

## 2014-08-30 ENCOUNTER — Other Ambulatory Visit: Payer: Self-pay | Admitting: *Deleted

## 2014-08-30 DIAGNOSIS — C50919 Malignant neoplasm of unspecified site of unspecified female breast: Secondary | ICD-10-CM

## 2014-09-05 ENCOUNTER — Ambulatory Visit: Payer: Self-pay | Admitting: Oncology

## 2014-09-05 ENCOUNTER — Other Ambulatory Visit: Payer: Self-pay

## 2014-09-19 ENCOUNTER — Encounter (INDEPENDENT_AMBULATORY_CARE_PROVIDER_SITE_OTHER): Payer: Self-pay

## 2014-09-19 ENCOUNTER — Encounter: Payer: Self-pay | Admitting: Oncology

## 2014-09-19 ENCOUNTER — Ambulatory Visit: Payer: Self-pay | Admitting: Oncology

## 2014-09-19 ENCOUNTER — Inpatient Hospital Stay: Payer: Medicaid Other | Attending: Oncology

## 2014-09-19 ENCOUNTER — Inpatient Hospital Stay (HOSPITAL_BASED_OUTPATIENT_CLINIC_OR_DEPARTMENT_OTHER): Payer: Medicaid Other | Admitting: Oncology

## 2014-09-19 ENCOUNTER — Other Ambulatory Visit: Payer: Self-pay

## 2014-09-19 VITALS — BP 121/82 | HR 83 | Temp 96.5°F | Ht 62.5 in | Wt 242.1 lb

## 2014-09-19 DIAGNOSIS — Z170 Estrogen receptor positive status [ER+]: Secondary | ICD-10-CM | POA: Diagnosis not present

## 2014-09-19 DIAGNOSIS — E869 Volume depletion, unspecified: Secondary | ICD-10-CM | POA: Insufficient documentation

## 2014-09-19 DIAGNOSIS — I890 Lymphedema, not elsewhere classified: Secondary | ICD-10-CM

## 2014-09-19 DIAGNOSIS — C50911 Malignant neoplasm of unspecified site of right female breast: Secondary | ICD-10-CM | POA: Insufficient documentation

## 2014-09-19 DIAGNOSIS — I10 Essential (primary) hypertension: Secondary | ICD-10-CM | POA: Insufficient documentation

## 2014-09-19 DIAGNOSIS — Z79899 Other long term (current) drug therapy: Secondary | ICD-10-CM | POA: Insufficient documentation

## 2014-09-19 DIAGNOSIS — E039 Hypothyroidism, unspecified: Secondary | ICD-10-CM | POA: Diagnosis not present

## 2014-09-19 DIAGNOSIS — K432 Incisional hernia without obstruction or gangrene: Secondary | ICD-10-CM | POA: Diagnosis not present

## 2014-09-19 DIAGNOSIS — K219 Gastro-esophageal reflux disease without esophagitis: Secondary | ICD-10-CM | POA: Diagnosis not present

## 2014-09-19 DIAGNOSIS — E669 Obesity, unspecified: Secondary | ICD-10-CM

## 2014-09-19 DIAGNOSIS — C50919 Malignant neoplasm of unspecified site of unspecified female breast: Secondary | ICD-10-CM

## 2014-09-19 DIAGNOSIS — Z79811 Long term (current) use of aromatase inhibitors: Secondary | ICD-10-CM | POA: Insufficient documentation

## 2014-09-19 DIAGNOSIS — Z923 Personal history of irradiation: Secondary | ICD-10-CM

## 2014-09-19 LAB — COMPREHENSIVE METABOLIC PANEL
ALBUMIN: 4.3 g/dL (ref 3.5–5.0)
ALT: 22 U/L (ref 14–54)
AST: 22 U/L (ref 15–41)
Alkaline Phosphatase: 79 U/L (ref 38–126)
Anion gap: 9 (ref 5–15)
BUN: 16 mg/dL (ref 6–20)
CALCIUM: 9.1 mg/dL (ref 8.9–10.3)
CO2: 29 mmol/L (ref 22–32)
CREATININE: 0.68 mg/dL (ref 0.44–1.00)
Chloride: 101 mmol/L (ref 101–111)
GFR calc Af Amer: >60 mL/min (ref >60)
GFR calc non Af Amer: >60 mL/min (ref >60)
Glucose, Bld: 117 mg/dL — ABNORMAL HIGH (ref 65–99)
Potassium: 3.6 mmol/L (ref 3.5–5.1)
Sodium: 139 mmol/L (ref 135–145)
TOTAL PROTEIN: 7.2 g/dL (ref 6.5–8.1)
Total Bilirubin: 0.6 mg/dL (ref 0.3–1.2)

## 2014-09-19 LAB — CBC WITH DIFFERENTIAL/PLATELET
Basophils Absolute: 0.1 10*3/uL (ref 0–0.1)
Basophils Relative: 1 %
Eosinophils Absolute: 0.1 10*3/uL (ref 0–0.7)
Eosinophils Relative: 1 %
HCT: 43.8 % (ref 35.0–47.0)
HEMOGLOBIN: 14.2 g/dL (ref 12.0–16.0)
LYMPHS ABS: 2 10*3/uL (ref 1.0–3.6)
Lymphocytes Relative: 25 %
MCH: 26.8 pg (ref 26.0–34.0)
MCHC: 32.4 g/dL (ref 32.0–36.0)
MCV: 82.6 fL (ref 80.0–100.0)
MONOS PCT: 6 %
Monocytes Absolute: 0.5 10*3/uL (ref 0.2–0.9)
Neutro Abs: 5.4 10*3/uL (ref 1.4–6.5)
Neutrophils Relative %: 67 %
PLATELETS: 210 10*3/uL (ref 150–440)
RBC: 5.3 MIL/uL — AB (ref 3.80–5.20)
RDW: 16.3 % — ABNORMAL HIGH (ref 11.5–14.5)
WBC: 8 10*3/uL (ref 3.6–11.0)

## 2014-09-19 NOTE — Progress Notes (Signed)
[facility] @ [facility] [phone]  Fax:([phone]) [place]: 1955-11-05  MR#: [id number]  PVX#:[id number]  Patient Care Team: Letta Median, MD as PCP - General  CHIEF COMPLAINT:  Chief Complaint  Patient presents with  . Follow-up    Oncology History    Chief Complaint/Problem List:  Carcinoma of breast T2N0M0. Estrogen receptor positive. Progesterone receptor positive. HER-2/neu negative by FISH study. AJCC Staging: pT2N0M0 Stage Grouping: II Cancer Status: No evidence of disease  Tamoxifen 20 mg daily starting in 2/08 tamoxifen has been discontinued in February of 2013 (finished 5 years of tamoxifen)  Started on Aromasin (extended adjuvant treatment) February of 2013     Carcinoma of right breast   06/21/2006 Initial Diagnosis Carcinoma of right breast    No flowsheet data found.  INTERVAL HISTORY: 59 year old lady here for further follow-up regarding carcinoma breast stage II disease presently on extra mustering this is a 17 year of extended adjuvant therapy.  Tolerating treatment very well.  No chills.  No fever.  Patient had a previous history of bleeding peptic ulcer for which patient was operated has now developed incisional hernia.  Patient is supposed reduce 10 pounds of weight prior to getting any surgery done for incisional hernia.  No bony pains.  Lymphedema grade 1 in right upper extremity  REVIEW OF SYSTEMS:   Gen. status: Patient is slightly apprehensive not any acute distress.  HEENT: No headache no dizziness.  No soreness in the mouth.  Lymphatic system no enlargement of lymph node noticed by patient.  Lungs: No cough or shortness of breath Cardiac: No chest pain or palpitation GI: No abdominal pain.  No nausea vomiting rectal bleeding Skin: No rash or ecchymosis Neurological system: No headache dizziness or any other focal signs Musculoskeletal system no bony pains All other systems have been reviewed  As per HPI.  Otherwise, a complete review of systems is negatve.  PAST MEDICAL HISTORY: Past Medical History  Diagnosis Date  . Breast cancer   . Clotting disorder   . Gout   . Arthritis   . Edema     lower extremity  . Allergy   . Anemia   . Obesity   . Carcinoma of right breast 09/19/2014    PAST SURGICAL HISTORY: Past Surgical History  Procedure Laterality Date  . Cholecystectomy    . Neck fusion    . Left breast biopsy    . Btl    . Breast lumpectomy      right breast    FAMILY HISTORY Significant   Significant History/PMH:   CHF:    Schizophrenia:    Sleep Apnea:    Lower extremity edema:    Hypertension:    Reflux:    Gout right great toe:    Pre-Diabetic:    Depression:    Hypothyroidism:    Obesity:    Right breast cancer:    Oncology Protocol: NSABP B-36 Protocol.  Pt receiving FEC from 11/07 - 4/07.   Left breast biopsy:    Neck Fusion:    Exploratory laparotomy:    BTL:    Lumpectomy right breast:    Hysterectomy - Partial:   Preventive Screening:  Has patient had any of the following test? Mammography   Last Mammography: Sept. 2014   [place]: Additional Past Medical and Surgical History: Past Medical History:  History of hypertension poorly managed.    Past Surgical History:  Hysterectomy in the past  Recent breast  surgery    Family History:  No family history of colorectal cancer, breast cancer, or ovarian cancer.   History of diabetes and hypertension in the family.     Social History:  Does not smoke. Does not drink alcohol.   GYNECOLOGIC HISTORY:  No LMP recorded.     ADVANCED DIRECTIVES:    HEALTH MAINTENANCE: History  Substance Use Topics  . Smoking status: Former Games developer  . Smokeless tobacco: Not on file  . Alcohol Use: Not on file       Allergies  Allergen Reactions  . Other     Patient is allergic to cloves    Current Outpatient Prescriptions  Medication Sig Dispense Refill  . ARIPiprazole (ABILIFY)  20 MG tablet Take 20 mg by mouth daily.    . calcium carbonate (OS-CAL) 600 MG TABS tablet Take 600 mg by mouth 2 (two) times daily with a meal.    . cetirizine (ZYRTEC) 10 MG tablet Take 10 mg by mouth at bedtime.    Marland Kitchen exemestane (AROMASIN) 25 MG tablet Take 25 mg by mouth daily after breakfast.    . ferrous sulfate 325 (65 FE) MG EC tablet Take 325 mg by mouth daily at 2 PM daily at 2 PM.    . furosemide (LASIX) 40 MG tablet Take 40 mg by mouth daily.    Marland Kitchen levothyroxine (SYNTHROID, LEVOTHROID) 88 MCG tablet Take 88 mcg by mouth daily before breakfast.    . lisinopril (PRINIVIL,ZESTRIL) 5 MG tablet Take 5 mg by mouth daily.    . metFORMIN (GLUCOPHAGE) 500 MG tablet Take by mouth daily.    Marland Kitchen omeprazole (PRILOSEC) 40 MG capsule Take 40 mg by mouth daily.    . potassium chloride (K-DUR,KLOR-CON) 10 MEQ tablet Take 10 mEq by mouth daily.    . sertraline (ZOLOFT) 100 MG tablet Take 100 mg by mouth daily.    Marland Kitchen HYDROcodone-acetaminophen (NORCO/VICODIN) 5-325 MG per tablet Take 1 tablet by mouth every 6 (six) hours as needed for moderate pain.    Marland Kitchen lisinopril (PRINIVIL,ZESTRIL) 2.5 MG tablet Take 2.5 mg by mouth daily.     No current facility-administered medications for this visit.    OBJECTIVE:  Filed Vitals:   09/19/14 1218  BP: 121/82  Pulse: 83  Temp: 96.5 F (35.8 C)     Body mass index is 43.54 kg/(m^2).    ECOG FS:1 - Symptomatic but completely ambulatory  PHYSICAL EXAM: Gen. status: Moderately obese lady not any acute distress.Head exam was generally normal. There was no scleral icterus or corneal arcus. Mucous membranes were moist.  Cardiac exam revealed the PMI to be normally situated and sized. The rhythm was regular and no extrasystoles were noted during several minutes of auscultation. The first and second heart sounds were normal and physiologic splitting of the second heart sound was noted. There were no murmurs, rubs, clicks, or gallops. Lymphatic system: No palpable  supraclavicular cervical axillary or required no adenopathy Examination of the chest was unremarkable. There were no bony deformities, no asymmetry, and no other abnormalities. Neck was supple and without jugular venous distension, thyromegaly, or carotid bruits. Carotids were easily palpable bilaterally. There was no adenopathy. Neurologically, the patient was awake, alert, and oriented to person, place and time. There were no obvious focal neurologic abnormalities. Abdomen: Incisional hernia.  Distention.  No ascites.  Liver spleen not palpable Lower extremity no edema right upper extremity grade 1 lymphedema Right breast: Previous surgery no palpable masses lab breast free of masses  LAB RESULTS:  Appointment on 09/19/2014  Component Date Value Ref Range Status  . WBC 09/19/2014 8.0  3.6 - 11.0 K/uL Final  . RBC 09/19/2014 5.30* 3.80 - 5.20 MIL/uL Final  . Hemoglobin 09/19/2014 14.2  12.0 - 16.0 g/dL Final  . HCT 09/19/2014 43.8  35.0 - 47.0 % Final  . MCV 09/19/2014 82.6  80.0 - 100.0 fL Final  . MCH 09/19/2014 26.8  26.0 - 34.0 pg Final  . MCHC 09/19/2014 32.4  32.0 - 36.0 g/dL Final  . RDW 09/19/2014 16.3* 11.5 - 14.5 % Final  . Platelets 09/19/2014 210  150 - 440 K/uL Final  . Neutrophils Relative % 09/19/2014 67   Final  . Neutro Abs 09/19/2014 5.4  1.4 - 6.5 K/uL Final  . Lymphocytes Relative 09/19/2014 25   Final  . Lymphs Abs 09/19/2014 2.0  1.0 - 3.6 K/uL Final  . Monocytes Relative 09/19/2014 6   Final  . Monocytes Absolute 09/19/2014 0.5  0.2 - 0.9 K/uL Final  . Eosinophils Relative 09/19/2014 1   Final  . Eosinophils Absolute 09/19/2014 0.1  0 - 0.7 K/uL Final  . Basophils Relative 09/19/2014 1   Final  . Basophils Absolute 09/19/2014 0.1  0 - 0.1 K/uL Final  . Sodium 09/19/2014 139  135 - 145 mmol/L Final  . Potassium 09/19/2014 3.6  3.5 - 5.1 mmol/L Final  . Chloride 09/19/2014 101  101 - 111 mmol/L Final  . CO2 09/19/2014 29  22 - 32 mmol/L Final  . Glucose, Bld  09/19/2014 117* 65 - 99 mg/dL Final  . BUN 09/19/2014 16  6 - 20 mg/dL Final  . Creatinine, Ser 09/19/2014 0.68  0.44 - 1.00 mg/dL Final  . Calcium 09/19/2014 9.1  8.9 - 10.3 mg/dL Final  . Total Protein 09/19/2014 7.2  6.5 - 8.1 g/dL Final  . Albumin 09/19/2014 4.3  3.5 - 5.0 g/dL Final  . AST 09/19/2014 22  15 - 41 U/L Final  . ALT 09/19/2014 22  14 - 54 U/L Final  . Alkaline Phosphatase 09/19/2014 79  38 - 126 U/L Final  . Total Bilirubin 09/19/2014 0.6  0.3 - 1.2 mg/dL Final  . GFR calc non Af Amer 09/19/2014 >60  >60 mL/min Final  . GFR calc Af Amer 09/19/2014 >60  >60 mL/min Final   Comment: (NOTE) The eGFR has been calculated using the CKD EPI equation. This calculation has not been validated in all clinical situations. eGFR's persistently <60 mL/min signify possible Chronic Kidney Disease.   . Anion gap 09/19/2014 9  5 - 15 Final    Lab Results  Component Value Date   LABCA2 9.1 02/22/2013   Lab Results  Component Value Date   LABCA2 9.1 02/22/2013     STUDIES: No results found.  ASSESSMENT: 59 year old lady with stage II carcinoma breast estrogen progesterone receptor positive status post lumpectomy radiation therapy now on extended adjuvant therapy with extra mustering tolerating treatment very well.  On clinical ground there is no evidence of recurrent disease Grade 1 right upper extremity lymphedema resolving History of bleeding peptic is also requiring surgery followed by incisional hernia  MEDICAL DECISION MAKING:  All the lab data has been reviewed.  Tumor markers are within normal limit.  On clinical ground there is no evidence of recurrent disease.  Another mammogram has been scheduled.  Bone density at the same time will be scheduled.  Patient expressed understanding and was in agreement with this plan. She also understands  that She can call clinic at any time with any questions, concerns, or complaints.    Carcinoma of right breast   Staging form:  Breast, AJCC 7th Edition     Clinical: Stage IIA (yT2, N0, M0) - Marni Griffon, MD   09/19/2014 1:50 PM

## 2014-11-22 ENCOUNTER — Encounter: Payer: Self-pay | Admitting: *Deleted

## 2014-11-23 ENCOUNTER — Encounter: Payer: Self-pay | Admitting: General Surgery

## 2014-11-27 ENCOUNTER — Ambulatory Visit (INDEPENDENT_AMBULATORY_CARE_PROVIDER_SITE_OTHER): Payer: Medicaid Other | Admitting: General Surgery

## 2014-11-27 ENCOUNTER — Encounter: Payer: Self-pay | Admitting: General Surgery

## 2014-11-27 VITALS — BP 122/78 | HR 84 | Temp 97.9°F | Resp 14 | Ht 62.0 in | Wt 234.0 lb

## 2014-11-27 DIAGNOSIS — K432 Incisional hernia without obstruction or gangrene: Secondary | ICD-10-CM

## 2014-11-27 DIAGNOSIS — L02211 Cutaneous abscess of abdominal wall: Secondary | ICD-10-CM | POA: Diagnosis not present

## 2014-11-27 NOTE — Patient Instructions (Signed)
Patient to return as needed. 

## 2014-11-27 NOTE — Progress Notes (Signed)
Patient ID: Amy Buchanan, female   DOB: 04/22/1956, 59 y.o.   MRN: [id number]  Chief Complaint  Patient presents with  . Other    abscess    HPI Amy Buchanan is a 59 y.o. female here today for a evaluation abscess in her lower left abdomen. Patient states the noticed the area about three week. The area drainage on Thursday afternoon.  She states she one of these abscess  about three months ago and was put on antibioticus in the same area. The patient was referred to [facility] for repair of her complex ventral hernia in early 2015. Referral to bariatrics was undertaken at that time, based on review of records from [facility], but the patient declined desiring to work on weight loss on her own. She may have lost 20 pounds since that time.  The patient reports prompt resolution of the swelling and erythema with institution of antibiotic therapy. She has had no symptoms to suggest partial intestinal obstruction. HPI  Past Medical History  Diagnosis Date  . Clotting disorder   . Gout   . Arthritis   . Edema     lower extremity  . Allergy   . Anemia   . Obesity   . Hemorrhoids   . Breast cancer   . Carcinoma of right breast 2007    T2, N0. ER positive, PR positive, HER-2/neu not overexpressing. Tamoxifen ending 2013, Aromasin started 2013.  Marland Kitchen Schizophrenia, schizoaffective, chronic 2004    Past Surgical History  Procedure Laterality Date  . Cholecystectomy    . Neck fusion    . Left breast biopsy    . Btl    . Breast lumpectomy      right breast  . Tubal ligation    . Colonoscopy  2014  . Upper gi endoscopy  2014    Family History  Problem Relation Age of Onset  . Heart disease Mother   . Cancer Father     lung  . Diabetes Brother   . Heart disease Brother     Social History History  Substance Use Topics  . Smoking status: Former Research scientist (life sciences)  . Smokeless tobacco: Not on file  . Alcohol Use: No    Allergies  Allergen Reactions  . Other     Patient is allergic to cloves      Current Outpatient Prescriptions  Medication Sig Dispense Refill  . ARIPiprazole (ABILIFY) 20 MG tablet Take 20 mg by mouth daily.    . calcium carbonate (OS-CAL) 600 MG TABS tablet Take 600 mg by mouth 2 (two) times daily with a meal.    . cetirizine (ZYRTEC) 10 MG tablet Take 10 mg by mouth at bedtime.    Marland Kitchen exemestane (AROMASIN) 25 MG tablet Take 25 mg by mouth daily after breakfast.    . ferrous sulfate 325 (65 FE) MG EC tablet Take 325 mg by mouth daily at 2 PM daily at 2 PM.    . furosemide (LASIX) 40 MG tablet Take 40 mg by mouth daily.    Marland Kitchen levothyroxine (SYNTHROID, LEVOTHROID) 88 MCG tablet Take 88 mcg by mouth daily before breakfast.    . lisinopril (PRINIVIL,ZESTRIL) 5 MG tablet Take 5 mg by mouth daily.    . metFORMIN (GLUCOPHAGE) 500 MG tablet Take by mouth daily.    . Multiple Vitamins-Minerals (MULTIVITAMIN WITH MINERALS) tablet Take 1 tablet by mouth daily.    Marland Kitchen omeprazole (PRILOSEC) 40 MG capsule Take 40 mg by mouth daily.    Marland Kitchen  potassium chloride (K-DUR,KLOR-CON) 10 MEQ tablet Take 10 mEq by mouth daily.    . sertraline (ZOLOFT) 100 MG tablet Take 100 mg by mouth daily.     No current facility-administered medications for this visit.    Review of Systems Review of Systems  Constitutional: Negative.   HENT: Negative.   Respiratory: Negative.   Cardiovascular: Negative.   Gastrointestinal: Negative.   Endocrine: Negative.   Genitourinary: Negative.   Allergic/Immunologic: Negative.   Neurological: Negative.   Hematological: Negative.   Psychiatric/Behavioral: Negative.     Blood pressure 122/78, pulse 84, temperature 97.9 F (36.6 C), resp. rate 14, height _0  (1.575 m), weight 234 lb (106.142 kg). BMI: 42.  Weight in April 2013: 300 pounds with a BMI of 54.   Physical Exam Physical Exam  Constitutional: She is oriented to person, place, and time. She appears well-developed and well-nourished.  Eyes: Conjunctivae are normal. No scleral icterus.  Neck:  Neck supple.  Cardiovascular: Normal rate, regular rhythm and normal heart sounds.   Pulses:      Dorsalis pedis pulses are 2+ on the right side, and 2+ on the left side.       Posterior tibial pulses are 2+ on the right side, and 2+ on the left side.  No edema   Pulmonary/Chest: Effort normal and breath sounds normal.  Abdominal: Soft. Normal appearance and bowel sounds are normal.    Lymphadenopathy:    She has no cervical adenopathy.  Neurological: She is alert and oriented to person, place, and time.  Skin: Skin is warm and dry.    Data Reviewed Operative reports from 2014-2015 including primary surgery for bleeding gastric ulcer, gastric polypectomy, multiple wound debridements and subsequent split thickness skin grafting. Procedures completed by all of the physicians at [facility].  April 2013 office records when the patient was seen for symptomatic hemorrhoids and rectal bleeding. Colonoscopy at that time was notable only for external hemorrhoids.  CT scan of 2014 documenting a 12 x 23 cm fascial defect in the upper midline.  2007 right breast cancer surgery operative report from Rochel Brome, M.D.  PCP notes of July 20 and 11/13/2014.   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    Plan    This cutaneous abscess area may be related to a retained stitch (although operative report showed only absorbable sutures were utilized) but at this time there is no indication for operative excision. The potential for injury of the underlying bowel with the scant area of soft tissue between the skin and the underlying intestines makes elective intervention on [place].  The patient is asymptomatic in regards to her ventral hernia, and although her BMI has improved from 2013 the present, she is still at high risk for recurrent herniation after elective repair until her BMI is below 30.  Symptomatic management of the cutaneous wound  is prudent, and the  patient and her son were encouraged to touch base with the [facility] staff once again (it is been over a year) further assessment regarding elective hernia repair.    Patient to return as needed. PCP:  Lisabeth Devoid 11/29/2014, 8:31 AM

## 2014-11-29 ENCOUNTER — Encounter: Payer: Self-pay | Admitting: General Surgery

## 2014-11-29 DIAGNOSIS — K432 Incisional hernia without obstruction or gangrene: Secondary | ICD-10-CM | POA: Insufficient documentation

## 2014-11-29 DIAGNOSIS — L02211 Cutaneous abscess of abdominal wall: Secondary | ICD-10-CM | POA: Insufficient documentation

## 2015-01-29 ENCOUNTER — Ambulatory Visit
Admission: RE | Admit: 2015-01-29 | Discharge: 2015-01-29 | Disposition: A | Discharge status: Home / Self Care | Payer: Medicaid Other | Source: Ambulatory Visit | Attending: Oncology | Admitting: Oncology

## 2015-01-29 DIAGNOSIS — Z853 Personal history of malignant neoplasm of breast: Secondary | ICD-10-CM | POA: Diagnosis not present

## 2015-01-29 DIAGNOSIS — Z1231 Encounter for screening mammogram for malignant neoplasm of breast: Secondary | ICD-10-CM | POA: Insufficient documentation

## 2015-01-29 DIAGNOSIS — M8580 Other specified disorders of bone density and structure, unspecified site: Secondary | ICD-10-CM | POA: Diagnosis present

## 2015-01-29 DIAGNOSIS — C50919 Malignant neoplasm of unspecified site of unspecified female breast: Secondary | ICD-10-CM

## 2015-01-29 HISTORY — DX: Reserved for concepts with insufficient information to code with codable children: IMO0002

## 2015-01-29 HISTORY — DX: Personal history of antineoplastic chemotherapy: Z92.21

## 2015-01-31 ENCOUNTER — Other Ambulatory Visit: Payer: Self-pay | Admitting: *Deleted

## 2015-01-31 MED ORDER — EXEMESTANE 25 MG PO TABS: 25.0000 mg | ORAL_TABLET | Freq: Every day | ORAL | Status: DC

## 2015-02-02 IMAGING — CR DG CHEST 1V PORT
1 series · 1 of 1 positions shown · non-contrast
Comparison: 09/21/2012

CLINICAL DATA: Status post central line placement

EXAM:
PORTABLE CHEST - 1 VIEW

[ap]
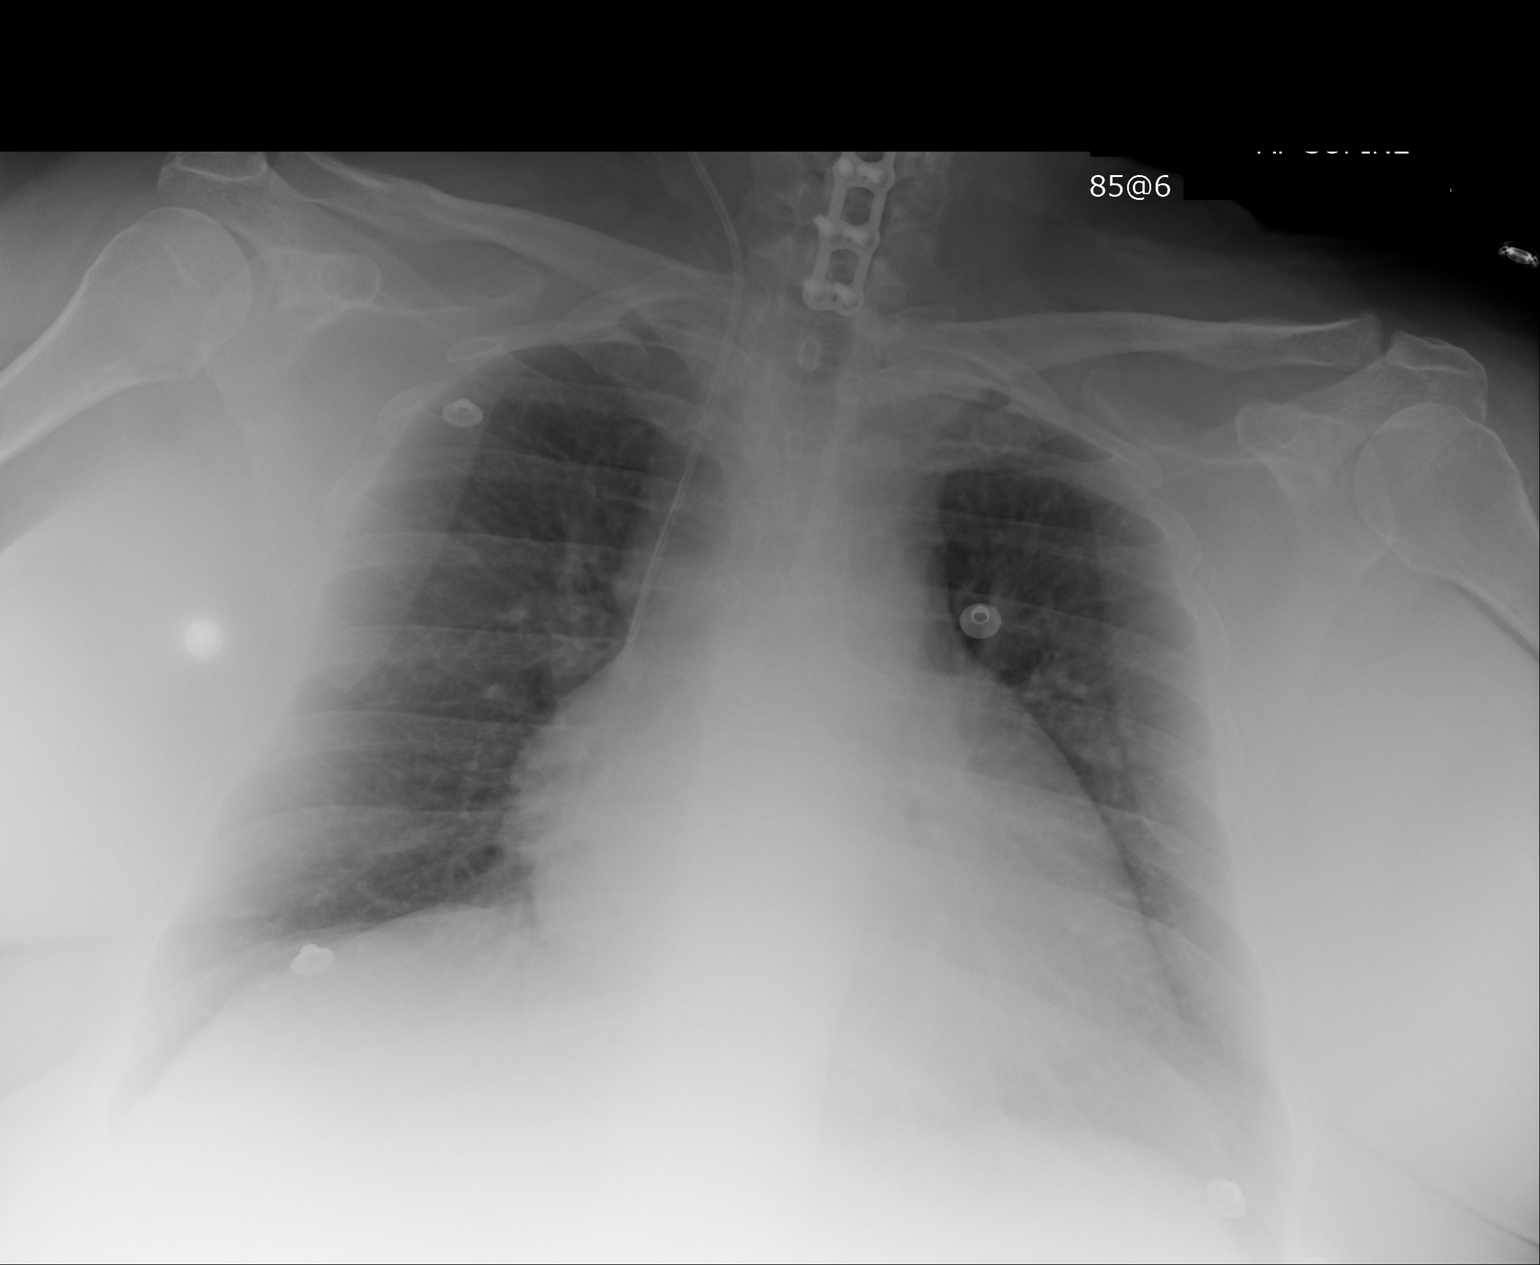

[1 of 1 positions shown; findings below may reference images not displayed]

FINDINGS: Cardiac shadow is stable. A right-sided central venous line is noted
with the tip at the cavoatrial junction. The lungs are clear. No
pneumothorax is seen. Postsurgical changes are noted in the cervical
spine.
IMPRESSION: No evidence of pneumothorax following central line placement.

## 2015-03-22 ENCOUNTER — Inpatient Hospital Stay: Payer: Medicaid Other | Admitting: Oncology

## 2015-03-22 ENCOUNTER — Inpatient Hospital Stay: Payer: Medicaid Other

## 2015-04-02 ENCOUNTER — Other Ambulatory Visit: Payer: Self-pay | Admitting: *Deleted

## 2015-04-02 NOTE — Telephone Encounter (Signed)
Complete what she has and will discuss at next appt. Son took message for patient since she is in class

## 2015-04-02 NOTE — Telephone Encounter (Signed)
Has been on extended therapy Exemestane since 06/2011 after 5 years of Tamoxifen. If she is to continue, she needs a refill. Has an appt on 12/5

## 2015-04-09 ENCOUNTER — Inpatient Hospital Stay: Payer: Medicaid Other | Attending: Oncology

## 2015-04-09 ENCOUNTER — Inpatient Hospital Stay (HOSPITAL_BASED_OUTPATIENT_CLINIC_OR_DEPARTMENT_OTHER): Payer: Medicaid Other | Admitting: Oncology

## 2015-04-09 ENCOUNTER — Encounter: Payer: Self-pay | Admitting: Oncology

## 2015-04-09 VITALS — BP 154/82 | HR 88 | Temp 96.0°F | Wt 247.1 lb

## 2015-04-09 DIAGNOSIS — K432 Incisional hernia without obstruction or gangrene: Secondary | ICD-10-CM | POA: Insufficient documentation

## 2015-04-09 DIAGNOSIS — C50919 Malignant neoplasm of unspecified site of unspecified female breast: Secondary | ICD-10-CM

## 2015-04-09 DIAGNOSIS — I890 Lymphedema, not elsewhere classified: Secondary | ICD-10-CM | POA: Diagnosis not present

## 2015-04-09 DIAGNOSIS — C50911 Malignant neoplasm of unspecified site of right female breast: Secondary | ICD-10-CM | POA: Insufficient documentation

## 2015-04-09 DIAGNOSIS — F259 Schizoaffective disorder, unspecified: Secondary | ICD-10-CM | POA: Insufficient documentation

## 2015-04-09 DIAGNOSIS — Z79899 Other long term (current) drug therapy: Secondary | ICD-10-CM

## 2015-04-09 DIAGNOSIS — Z79811 Long term (current) use of aromatase inhibitors: Secondary | ICD-10-CM | POA: Diagnosis not present

## 2015-04-09 DIAGNOSIS — Z923 Personal history of irradiation: Secondary | ICD-10-CM | POA: Insufficient documentation

## 2015-04-09 DIAGNOSIS — E669 Obesity, unspecified: Secondary | ICD-10-CM | POA: Diagnosis not present

## 2015-04-09 DIAGNOSIS — Z170 Estrogen receptor positive status [ER+]: Secondary | ICD-10-CM | POA: Insufficient documentation

## 2015-04-09 DIAGNOSIS — Z87891 Personal history of nicotine dependence: Secondary | ICD-10-CM

## 2015-04-09 LAB — COMPREHENSIVE METABOLIC PANEL
ALBUMIN: 4.3 g/dL (ref 3.5–5.0)
ALK PHOS: 75 U/L (ref 38–126)
ALT: 19 U/L (ref 14–54)
AST: 20 U/L (ref 15–41)
Anion gap: 10 (ref 5–15)
BILIRUBIN TOTAL: 0.5 mg/dL (ref 0.3–1.2)
BUN: 18 mg/dL (ref 6–20)
CALCIUM: 8.9 mg/dL (ref 8.9–10.3)
CO2: 25 mmol/L (ref 22–32)
CREATININE: 0.89 mg/dL (ref 0.44–1.00)
Chloride: 101 mmol/L (ref 101–111)
GFR calc Af Amer: >60 mL/min (ref >60)
GLUCOSE: 121 mg/dL — AB (ref 65–99)
Potassium: 3.6 mmol/L (ref 3.5–5.1)
Sodium: 136 mmol/L (ref 135–145)
TOTAL PROTEIN: 7 g/dL (ref 6.5–8.1)

## 2015-04-09 LAB — CBC WITH DIFFERENTIAL/PLATELET
BASOS ABS: 0.1 10*3/uL (ref 0–0.1)
BASOS PCT: 1 %
Eosinophils Absolute: 0.1 10*3/uL (ref 0–0.7)
Eosinophils Relative: 2 %
HEMATOCRIT: 43.4 % (ref 35.0–47.0)
HEMOGLOBIN: 14.3 g/dL (ref 12.0–16.0)
LYMPHS PCT: 23 %
Lymphs Abs: 1.8 10*3/uL (ref 1.0–3.6)
MCH: 27.2 pg (ref 26.0–34.0)
MCHC: 32.9 g/dL (ref 32.0–36.0)
MCV: 82.9 fL (ref 80.0–100.0)
MONO ABS: 0.4 10*3/uL (ref 0.2–0.9)
Monocytes Relative: 6 %
NEUTROS ABS: 5.5 10*3/uL (ref 1.4–6.5)
NEUTROS PCT: 70 %
Platelets: 206 10*3/uL (ref 150–440)
RBC: 5.24 MIL/uL — ABNORMAL HIGH (ref 3.80–5.20)
RDW: 15 % — AB (ref 11.5–14.5)
WBC: 7.9 10*3/uL (ref 3.6–11.0)

## 2015-04-09 NOTE — Progress Notes (Signed)
[place] @ [facility] [phone]  Fax:(336) [place]: Jan 29, 1956  MR#: [id number]  UDJ#:[id number]  Patient Care Team: Letta Median, MD as PCP - General Robert Bellow, MD as Consulting Physician (General Surgery) Abby Cindy Hazy, MD as Referring Physician (Family Medicine)  CHIEF COMPLAINT:  Chief Complaint  Patient presents with  . Breast Cancer    Oncology History    Chief Complaint/Problem List:  Carcinoma of breast T2N0M0. Estrogen receptor positive. Progesterone receptor positive. HER-2/neu negative by FISH study. AJCC Staging: pT2N0M0 Stage Grouping: II Cancer Status: No evidence of disease  Tamoxifen 20 mg daily starting in 2/08 tamoxifen has been discontinued in February of 2013 (finished 5 years of tamoxifen)  Started on Aromasin (extended adjuvant treatment) February of 2013     Carcinoma of right breast ([place])   06/21/2006 Initial Diagnosis Carcinoma of right breast    No flowsheet data found.  INTERVAL HISTORY: 59 year old lady here for further follow-up regarding carcinoma breast stage II disease presently on extra mustering this is a 59 year of extended adjuvant therapy.  Tolerating treatment very well.  No chills.  No fever.  Patient had a previous history of bleeding peptic ulcer for which patient was operated has now developed incisional hernia.  Patient is supposed reduce 10 pounds of weight prior to getting any surgery done for incisional hernia.  No bony pains.  Lymphedema grade 1 in right upper extremity  REVIEW OF SYSTEMS:   Gen. status: Patient is slightly apprehensive not any acute distress.  HEENT: No headache no dizziness.  No soreness in the mouth.  Lymphatic system no enlargement of lymph node noticed by patient.  Lungs: No cough or shortness of breath Cardiac: No chest pain or palpitation GI: No abdominal pain.  No nausea vomiting rectal bleeding Skin: No rash or ecchymosis Neurological system: No  headache dizziness or any other focal signs Musculoskeletal system no bony pains All other systems have been reviewed  As per HPI. Otherwise, a complete review of systems is negatve.  PAST MEDICAL HISTORY: Past Medical History  Diagnosis Date  . Clotting disorder ([place])   . Gout   . Arthritis   . Edema     lower extremity  . Allergy   . Anemia   . Obesity   . Hemorrhoids   . Carcinoma of right breast ([place]) 2007    T2, N0. ER positive, PR positive, HER-2/neu not overexpressing. Tamoxifen ending 2013, Aromasin started 2013.  Marland Kitchen Schizophrenia, schizoaffective, chronic ([place]) 2004  . Breast cancer ([place]) 2007    RT LUMPECTOMY  . S/P chemotherapy, time since greater than 12 weeks 2007    BREAST CA  . Radiation 2007    BREAST CA    PAST SURGICAL HISTORY: Past Surgical History  Procedure Laterality Date  . Cholecystectomy    . Neck fusion    . Left breast biopsy    . Btl    . Breast lumpectomy      right breast  . Tubal ligation    . Colonoscopy  2014  . Upper gi endoscopy  2014    FAMILY HISTORY Significant   Significant History/PMH:   CHF:    Schizophrenia:    Sleep Apnea:    Lower extremity edema:    Hypertension:    Reflux:    Gout right great toe:    Pre-Diabetic:    Depression:    Hypothyroidism:    Obesity:    Right breast  cancer:    Oncology Protocol: NSABP B-36 Protocol.  Pt receiving FEC from 11/07 - 4/07.   Left breast biopsy:    Neck Fusion:    Exploratory laparotomy:    BTL:    Lumpectomy right breast:    Hysterectomy - Partial:   Preventive Screening:  Has patient had any of the following test? Mammography   Last Mammography: Sept. 2014   [place]: Additional Past Medical and Surgical History: Past Medical History:  History of hypertension poorly managed.    Past Surgical History:  Hysterectomy in the past  Recent breast surgery    Family History:  No family history of colorectal cancer, breast cancer, or ovarian  cancer.   History of diabetes and hypertension in the family.     Social History:  Does not smoke. Does not drink alcohol.   GYNECOLOGIC HISTORY:  No LMP recorded. Patient has had a hysterectomy.     ADVANCED DIRECTIVES:    HEALTH MAINTENANCE: Social History  Substance Use Topics  . Smoking status: Former Research scientist (life sciences)  . Smokeless tobacco: None  . Alcohol Use: No       Allergies  Allergen Reactions  . Other     Patient is allergic to cloves    Current Outpatient Prescriptions  Medication Sig Dispense Refill  . ARIPiprazole (ABILIFY) 20 MG tablet Take 20 mg by mouth daily.    . calcium carbonate (OS-CAL) 600 MG TABS tablet Take 600 mg by mouth 2 (two) times daily with a meal.    . cetirizine (ZYRTEC) 10 MG tablet Take 10 mg by mouth at bedtime.    Marland Kitchen exemestane (AROMASIN) 25 MG tablet Take 1 tablet (25 mg total) by mouth daily after breakfast. 30 tablet 1  . ferrous sulfate 325 (65 FE) MG EC tablet Take 325 mg by mouth daily at 2 PM daily at 2 PM.    . furosemide (LASIX) 40 MG tablet Take 40 mg by mouth daily.    Marland Kitchen levothyroxine (SYNTHROID, LEVOTHROID) 88 MCG tablet Take 88 mcg by mouth daily before breakfast.    . lisinopril (PRINIVIL,ZESTRIL) 5 MG tablet Take 5 mg by mouth daily.    . metFORMIN (GLUCOPHAGE) 500 MG tablet Take by mouth daily.    . Multiple Vitamins-Minerals (MULTIVITAMIN WITH MINERALS) tablet Take 1 tablet by mouth daily.    Marland Kitchen omeprazole (PRILOSEC) 40 MG capsule Take 40 mg by mouth daily.    . potassium chloride (K-DUR,KLOR-CON) 10 MEQ tablet Take 10 mEq by mouth daily.    . sertraline (ZOLOFT) 100 MG tablet Take 100 mg by mouth daily.     No current facility-administered medications for this visit.    OBJECTIVE:  Filed Vitals:   04/09/15 1126  BP: 154/82  Pulse: 88  Temp: 96 F (35.6 C)     Body mass index is 45.19 kg/(m^2).    ECOG FS:1 - Symptomatic but completely ambulatory  PHYSICAL EXAM: Gen. status: Moderately obese lady not any acute  distress.Head exam was generally normal. There was no scleral icterus or corneal arcus. Mucous membranes were moist.  Cardiac exam revealed the PMI to be normally situated and sized. The rhythm was regular and no extrasystoles were noted during several minutes of auscultation. The first and second heart sounds were normal and physiologic splitting of the second heart sound was noted. There were no murmurs, rubs, clicks, or gallops. Lymphatic system: No palpable supraclavicular cervical axillary or required no adenopathy Examination of the chest was unremarkable. There were no bony deformities,  no asymmetry, and no other abnormalities. Neck was supple and without jugular venous distension, thyromegaly, or carotid bruits. Carotids were easily palpable bilaterally. There was no adenopathy. Neurologically, the patient was awake, alert, and oriented to person, place and time. There were no obvious focal neurologic abnormalities. Abdomen: Incisional hernia.  Distention.  No ascites.  Liver spleen not palpable Lower extremity no edema right upper extremity grade 1 lymphedema Right breast: Previous surgery no palpable masses lab breast free of masses   LAB RESULTS:  Appointment on 04/09/2015  Component Date Value Ref Range Status  . WBC 04/09/2015 7.9  3.6 - 11.0 K/uL Final  . RBC 04/09/2015 5.24* 3.80 - 5.20 MIL/uL Final  . Hemoglobin 04/09/2015 14.3  12.0 - 16.0 g/dL Final  . HCT 04/09/2015 43.4  35.0 - 47.0 % Final  . MCV 04/09/2015 82.9  80.0 - 100.0 fL Final  . MCH 04/09/2015 27.2  26.0 - 34.0 pg Final  . MCHC 04/09/2015 32.9  32.0 - 36.0 g/dL Final  . RDW 04/09/2015 15.0* 11.5 - 14.5 % Final  . Platelets 04/09/2015 206  150 - 440 K/uL Final  . Neutrophils Relative % 04/09/2015 70   Final  . Neutro Abs 04/09/2015 5.5  1.4 - 6.5 K/uL Final  . Lymphocytes Relative 04/09/2015 23   Final  . Lymphs Abs 04/09/2015 1.8  1.0 - 3.6 K/uL Final  . Monocytes Relative 04/09/2015 6   Final  . Monocytes  Absolute 04/09/2015 0.4  0.2 - 0.9 K/uL Final  . Eosinophils Relative 04/09/2015 2   Final  . Eosinophils Absolute 04/09/2015 0.1  0 - 0.7 K/uL Final  . Basophils Relative 04/09/2015 1   Final  . Basophils Absolute 04/09/2015 0.1  0 - 0.1 K/uL Final  . Sodium 04/09/2015 136  135 - 145 mmol/L Final  . Potassium 04/09/2015 3.6  3.5 - 5.1 mmol/L Final  . Chloride 04/09/2015 101  101 - 111 mmol/L Final  . CO2 04/09/2015 25  22 - 32 mmol/L Final  . Glucose, Bld 04/09/2015 121* 65 - 99 mg/dL Final  . BUN 04/09/2015 18  6 - 20 mg/dL Final  . Creatinine, Ser 04/09/2015 0.89  0.44 - 1.00 mg/dL Final  . Calcium 04/09/2015 8.9  8.9 - 10.3 mg/dL Final  . Total Protein 04/09/2015 7.0  6.5 - 8.1 g/dL Final  . Albumin 04/09/2015 4.3  3.5 - 5.0 g/dL Final  . AST 04/09/2015 20  15 - 41 U/L Final  . ALT 04/09/2015 19  14 - 54 U/L Final  . Alkaline Phosphatase 04/09/2015 75  38 - 126 U/L Final  . Total Bilirubin 04/09/2015 0.5  0.3 - 1.2 mg/dL Final  . GFR calc non Af Amer 04/09/2015 >60  >60 mL/min Final  . GFR calc Af Amer 04/09/2015 >60  >60 mL/min Final   Comment: (NOTE) The eGFR has been calculated using the CKD EPI equation. This calculation has not been validated in all clinical situations. eGFR's persistently <60 mL/min signify possible Chronic Kidney Disease.   . Anion gap 04/09/2015 10  5 - 15 Final    Lab Results  Component Value Date   LABCA2 9.1 02/22/2013   Lab Results  Component Value Date   LABCA2 9.1 02/22/2013     STUDIES: No results found.  ASSESSMENT: 59 year old lady with stage II carcinoma breast estrogen progesterone receptor positive status post lumpectomy radiation therapy now on extended adjuvant therapy with Aromasin tolerating treatment very well.  On clinical ground there is no  evidence of recurrent disease Grade 1 right upper extremity lymphedema resolving History of bleeding peptic is also requiring surgery followed by incisional hernia  MEDICAL DECISION  MAKING:  Patient has finished total 3 years of extended adjuvant therapy We will send breast cancer index to decide whether patient needs any further continuation of Aromasin On clinical examination there is no evidence of recurrent disease Patient already had her flu vaccine Patient expressed understanding and was in agreement with this plan. She also understands that She can call clinic at any time with any questions, concerns, or complaints.    Carcinoma of right breast   Staging form: Breast, AJCC 7th Edition     Clinical: Stage IIA (yT2, N0, M0) - Marni Griffon, MD   04/09/2015 11:46 AM

## 2015-04-17 ENCOUNTER — Encounter: Payer: Self-pay | Admitting: Oncology

## 2015-06-22 ENCOUNTER — Ambulatory Visit: Payer: Medicaid Other | Attending: Otolaryngology

## 2015-06-22 DIAGNOSIS — G4733 Obstructive sleep apnea (adult) (pediatric): Secondary | ICD-10-CM | POA: Diagnosis not present

## 2015-07-24 ENCOUNTER — Ambulatory Visit: Payer: Medicaid Other | Attending: Otolaryngology

## 2015-07-24 DIAGNOSIS — G4733 Obstructive sleep apnea (adult) (pediatric): Secondary | ICD-10-CM | POA: Diagnosis not present

## 2015-10-08 ENCOUNTER — Inpatient Hospital Stay: Payer: Medicaid Other | Attending: Oncology

## 2015-10-08 ENCOUNTER — Other Ambulatory Visit: Payer: Medicaid Other

## 2015-10-08 ENCOUNTER — Inpatient Hospital Stay (HOSPITAL_BASED_OUTPATIENT_CLINIC_OR_DEPARTMENT_OTHER): Payer: Medicaid Other | Admitting: Oncology

## 2015-10-08 ENCOUNTER — Ambulatory Visit: Payer: Medicaid Other | Admitting: Oncology

## 2015-10-08 VITALS — BP 116/75 | HR 78 | Temp 98.4°F | Resp 16 | Wt 271.2 lb

## 2015-10-08 DIAGNOSIS — Z923 Personal history of irradiation: Secondary | ICD-10-CM | POA: Insufficient documentation

## 2015-10-08 DIAGNOSIS — Z170 Estrogen receptor positive status [ER+]: Secondary | ICD-10-CM | POA: Diagnosis not present

## 2015-10-08 DIAGNOSIS — M109 Gout, unspecified: Secondary | ICD-10-CM | POA: Insufficient documentation

## 2015-10-08 DIAGNOSIS — Z79899 Other long term (current) drug therapy: Secondary | ICD-10-CM | POA: Diagnosis not present

## 2015-10-08 DIAGNOSIS — M1990 Unspecified osteoarthritis, unspecified site: Secondary | ICD-10-CM

## 2015-10-08 DIAGNOSIS — Z7984 Long term (current) use of oral hypoglycemic drugs: Secondary | ICD-10-CM | POA: Insufficient documentation

## 2015-10-08 DIAGNOSIS — C50911 Malignant neoplasm of unspecified site of right female breast: Secondary | ICD-10-CM | POA: Insufficient documentation

## 2015-10-08 DIAGNOSIS — Z87891 Personal history of nicotine dependence: Secondary | ICD-10-CM | POA: Diagnosis not present

## 2015-10-08 DIAGNOSIS — Z9221 Personal history of antineoplastic chemotherapy: Secondary | ICD-10-CM | POA: Diagnosis not present

## 2015-10-08 DIAGNOSIS — Z79811 Long term (current) use of aromatase inhibitors: Secondary | ICD-10-CM | POA: Insufficient documentation

## 2015-10-08 DIAGNOSIS — Z801 Family history of malignant neoplasm of trachea, bronchus and lung: Secondary | ICD-10-CM | POA: Insufficient documentation

## 2015-10-08 LAB — CBC WITH DIFFERENTIAL/PLATELET
BASOS ABS: 0.1 10*3/uL (ref 0–0.1)
BASOS PCT: 1 %
EOS PCT: 1 %
Eosinophils Absolute: 0.1 10*3/uL (ref 0–0.7)
HCT: 43.7 % (ref 35.0–47.0)
Hemoglobin: 14.6 g/dL (ref 12.0–16.0)
Lymphocytes Relative: 24 %
Lymphs Abs: 2.1 10*3/uL (ref 1.0–3.6)
MCH: 27.9 pg (ref 26.0–34.0)
MCHC: 33.4 g/dL (ref 32.0–36.0)
MCV: 83.5 fL (ref 80.0–100.0)
MONO ABS: 0.7 10*3/uL (ref 0.2–0.9)
Monocytes Relative: 8 %
Neutro Abs: 5.6 10*3/uL (ref 1.4–6.5)
Neutrophils Relative %: 66 %
PLATELETS: 210 10*3/uL (ref 150–440)
RBC: 5.24 MIL/uL — ABNORMAL HIGH (ref 3.80–5.20)
RDW: 14.9 % — AB (ref 11.5–14.5)
WBC: 8.5 10*3/uL (ref 3.6–11.0)

## 2015-10-08 LAB — COMPREHENSIVE METABOLIC PANEL
ALBUMIN: 4.5 g/dL (ref 3.5–5.0)
ALT: 27 U/L (ref 14–54)
AST: 26 U/L (ref 15–41)
Alkaline Phosphatase: 78 U/L (ref 38–126)
Anion gap: 7 (ref 5–15)
BUN: 23 mg/dL — AB (ref 6–20)
CHLORIDE: 104 mmol/L (ref 101–111)
CO2: 27 mmol/L (ref 22–32)
Calcium: 9 mg/dL (ref 8.9–10.3)
Creatinine, Ser: 0.84 mg/dL (ref 0.44–1.00)
GFR calc Af Amer: >60 mL/min (ref >60)
GFR calc non Af Amer: >60 mL/min (ref >60)
GLUCOSE: 89 mg/dL (ref 65–99)
POTASSIUM: 3.7 mmol/L (ref 3.5–5.1)
SODIUM: 138 mmol/L (ref 135–145)
Total Bilirubin: 0.6 mg/dL (ref 0.3–1.2)
Total Protein: 7.5 g/dL (ref 6.5–8.1)

## 2015-10-08 NOTE — Progress Notes (Signed)
Patient does not offer any problems today. Does need a breast exam today but does not want to put on our hospital gown due fitting small so she is wearing clothes that is convenient for a breast exam.

## 2015-10-13 NOTE — Progress Notes (Signed)
[place]  Telephone:(336) [phone] Fax:(336) [phone]  ID: Amy Buchanan OB: 03/27/1956  MR#: [id number]  BWG#:[id number]  Patient Care Team: Letta Median, MD as PCP - General Robert Bellow, MD as Consulting Physician (General Surgery) Abby Cindy Hazy, MD as Referring Physician (Family Medicine)  CHIEF COMPLAINT:  Chief Complaint  Patient presents with  . Breast Cancer    INTERVAL HISTORY: Patient returns to clinic today for routine yearly follow-up of her breast cancer. She continues to feel well and remains asymptomatic. She is no neurologic complaints. She denies any recent fevers or illnesses. She has a good appetite and denies weight loss. She denies any chest pain or shortness of breath. She denies any nausea, vomiting, constipation, or diarrhea. She has no urinary complaints. Patient feels at her baseline and offers no specific complaints today.  REVIEW OF SYSTEMS:   Review of Systems  Constitutional: Negative.  Negative for fever, weight loss and malaise/fatigue.  Respiratory: Negative.  Negative for cough and shortness of breath.   Cardiovascular: Negative.   Gastrointestinal: Negative.   Genitourinary: Negative.   Musculoskeletal: Negative.   Neurological: Negative.  Negative for weakness.  Psychiatric/Behavioral: Negative.     As per HPI. Otherwise, a complete review of systems is negatve.  PAST MEDICAL HISTORY: Past Medical History  Diagnosis Date  . Clotting disorder ([place])   . Gout   . Arthritis   . Edema     lower extremity  . Allergy   . Anemia   . Obesity   . Hemorrhoids   . Carcinoma of right breast ([place]) 2007    T2, N0. ER positive, PR positive, HER-2/neu not overexpressing. Tamoxifen ending 2013, Aromasin started 2013.  Marland Kitchen Schizophrenia, schizoaffective, chronic ([place]) 2004  . Breast cancer ([place]) 2007    RT LUMPECTOMY  . S/P chemotherapy, time since greater than 12 weeks 2007    BREAST CA  . Radiation 2007   BREAST CA    PAST SURGICAL HISTORY: Past Surgical History  Procedure Laterality Date  . Cholecystectomy    . Neck fusion    . Left breast biopsy    . Btl    . Breast lumpectomy      right breast  . Tubal ligation    . Colonoscopy  2014  . Upper gi endoscopy  2014    FAMILY HISTORY Family History  Problem Relation Age of Onset  . Heart disease Mother   . Cancer Father     lung  . Diabetes Brother   . Heart disease Brother   . Breast cancer Neg Hx        ADVANCED DIRECTIVES:    HEALTH MAINTENANCE: Social History  Substance Use Topics  . Smoking status: Former Research scientist (life sciences)  . Smokeless tobacco: Not on file  . Alcohol Use: No     Colonoscopy:  PAP:  Bone density:  Lipid panel:  Allergies  Allergen Reactions  . Other     Patient is allergic to cloves    Current Outpatient Prescriptions  Medication Sig Dispense Refill  . ARIPiprazole (ABILIFY) 20 MG tablet Take 20 mg by mouth daily.    . calcium carbonate (OS-CAL) 600 MG TABS tablet Take 600 mg by mouth 2 (two) times daily with a meal.    . cetirizine (ZYRTEC) 10 MG tablet Take 10 mg by mouth at bedtime.    Marland Kitchen exemestane (AROMASIN) 25 MG tablet Take 1 tablet (25 mg total) by mouth daily after breakfast. 30 tablet 1  .  ferrous sulfate 325 (65 FE) MG EC tablet Take 325 mg by mouth daily at 2 PM daily at 2 PM.    . furosemide (LASIX) 40 MG tablet Take 40 mg by mouth daily.    Marland Kitchen levothyroxine (SYNTHROID, LEVOTHROID) 88 MCG tablet Take 88 mcg by mouth daily before breakfast.    . lisinopril (PRINIVIL,ZESTRIL) 5 MG tablet Take 5 mg by mouth daily.    . metFORMIN (GLUCOPHAGE) 500 MG tablet Take by mouth daily.    . Multiple Vitamins-Minerals (MULTIVITAMIN WITH MINERALS) tablet Take 1 tablet by mouth daily.    Marland Kitchen omeprazole (PRILOSEC) 40 MG capsule Take 40 mg by mouth daily.    . potassium chloride (K-DUR,KLOR-CON) 10 MEQ tablet Take 10 mEq by mouth daily.    . sertraline (ZOLOFT) 100 MG tablet Take 100 mg by mouth daily.      No current facility-administered medications for this visit.    OBJECTIVE: Filed Vitals:   10/08/15 1125  BP: 116/75  Pulse: 78  Temp: 98.4 F (36.9 C)  Resp: 16     Body mass index is 49.58 kg/(m^2).    ECOG FS:0 - Asymptomatic  General: Well-developed, well-nourished, no acute distress. Eyes: Pink conjunctiva, anicteric sclera. Breasts: Bilateral breast and axilla without lumps or masses. Lungs: Clear to auscultation bilaterally. Heart: Regular rate and rhythm. No rubs, murmurs, or gallops. Abdomen: Soft, nontender, nondistended. No organomegaly noted, normoactive bowel sounds. Musculoskeletal: No edema, cyanosis, or clubbing. Neuro: Alert, answering all questions appropriately. Cranial nerves grossly intact. Skin: No rashes or petechiae noted. Psych: Normal affect.  LAB RESULTS:  Lab Results  Component Value Date   NA 138 10/08/2015   K 3.7 10/08/2015   CL 104 10/08/2015   CO2 27 10/08/2015   GLUCOSE 89 10/08/2015   BUN 23* 10/08/2015   CREATININE 0.84 10/08/2015   CALCIUM 9.0 10/08/2015   PROT 7.5 10/08/2015   ALBUMIN 4.5 10/08/2015   AST 26 10/08/2015   ALT 27 10/08/2015   ALKPHOS 78 10/08/2015   BILITOT 0.6 10/08/2015   GFRNONAA >60 10/08/2015   GFRAA >60 10/08/2015    Lab Results  Component Value Date   WBC 8.5 10/08/2015   NEUTROABS 5.6 10/08/2015   HGB 14.6 10/08/2015   HCT 43.7 10/08/2015   MCV 83.5 10/08/2015   PLT 210 10/08/2015     STUDIES: No results found.  ASSESSMENT: Stage IIa, ER/PR positive adenocarcinoma of the right breast.  PLAN:    1. Breast cancer: Patient was initially diagnosed in February 2008. She completed 5 years of tamoxifen in February 2013 been also recommended to receive extended adjuvant treatment with Aromasin which she can discontinued in December 2016. Patient's most recent mammogram in September 2016 was reported as BI-RADS 2, repeat in September 2017. Return to clinic in 1 year for further evaluation. At this  point, patient will be 10 years out from her diagnosis and possibly could be discharged from clinic.  Patient expressed understanding and was in agreement with this plan. She also understands that She can call clinic at any time with any questions, concerns, or complaints.   Carcinoma of right breast [facility])   Staging form: Breast, AJCC 7th Edition     Clinical: Stage IIA (yT2, N0, M0) - Unsigned   Lloyd Huger, MD   10/13/2015 1:57 PM

## 2016-01-30 ENCOUNTER — Ambulatory Visit
Admission: RE | Admit: 2016-01-30 | Discharge: 2016-01-30 | Disposition: A | Discharge status: Home / Self Care | Payer: Medicaid Other | Source: Ambulatory Visit | Attending: Oncology | Admitting: Oncology

## 2016-01-30 DIAGNOSIS — Z853 Personal history of malignant neoplasm of breast: Secondary | ICD-10-CM | POA: Insufficient documentation

## 2016-01-30 DIAGNOSIS — Z1231 Encounter for screening mammogram for malignant neoplasm of breast: Secondary | ICD-10-CM | POA: Diagnosis present

## 2016-01-30 DIAGNOSIS — C50911 Malignant neoplasm of unspecified site of right female breast: Secondary | ICD-10-CM

## 2016-05-01 ENCOUNTER — Other Ambulatory Visit: Payer: Self-pay | Admitting: Family Medicine

## 2016-05-01 DIAGNOSIS — M79605 Pain in left leg: Secondary | ICD-10-CM

## 2016-05-02 ENCOUNTER — Ambulatory Visit
Admission: RE | Admit: 2016-05-02 | Discharge: 2016-05-02 | Disposition: A | Discharge status: Home / Self Care | Payer: Medicaid Other | Source: Ambulatory Visit | Attending: Family Medicine | Admitting: Family Medicine

## 2016-05-02 DIAGNOSIS — M79605 Pain in left leg: Secondary | ICD-10-CM | POA: Insufficient documentation

## 2016-10-06 NOTE — Progress Notes (Signed)
[place]  Telephone:(336) [phone] Fax:(336) [phone]  ID: Amy Buchanan OB: October 13, 1955  MR#: [id number]  WPV#:[id number]  Patient Care Team: Letta Median, MD as PCP - General Byrnett, Forest Gleason, MD as Consulting Physician (General Surgery) Letta Median, MD as Referring Physician [facility])  CHIEF COMPLAINT: Pathologic stage IIa ER/PR positive, HER-2 negative invasive carcinoma of the right breast, unspecified site.    INTERVAL HISTORY: Patient returns to clinic today for routine yearly follow-up of her breast cancer. She continues to feel well and remains asymptomatic. She has no neurologic complaints. She denies any recent fevers or illnesses. She has a good appetite and denies weight loss. She denies any chest pain or shortness of breath. She denies any nausea, vomiting, constipation, or diarrhea. She has no urinary complaints. Patient feels at her baseline and offers no specific complaints today.  REVIEW OF SYSTEMS:   Review of Systems  Constitutional: Negative.  Negative for fever, malaise/fatigue and weight loss.  Respiratory: Negative.  Negative for cough and shortness of breath.   Cardiovascular: Negative.  Negative for chest pain and leg swelling.  Gastrointestinal: Negative.  Negative for abdominal pain.  Genitourinary: Negative.   Musculoskeletal: Negative.   Skin: Negative.  Negative for rash.  Neurological: Negative.  Negative for sensory change and weakness.  Psychiatric/Behavioral: Negative.  The patient is not nervous/anxious.     As per HPI. Otherwise, a complete review of systems is negative.  PAST MEDICAL HISTORY: Past Medical History:  Diagnosis Date  . Allergy   . Anemia   . Arthritis   . Basal cell carcinoma (BCC) of eyelid   . Breast cancer ([place]) 2007   RT LUMPECTOMY  . Carcinoma of right breast ([place]) 2007   T2, N0. ER positive, PR positive, HER-2/neu not overexpressing. Tamoxifen ending 2013, Aromasin  started 2013.  Marland Kitchen Clotting disorder ([place])   . Edema    lower extremity  . Gout   . Hemorrhoids   . Obesity   . Radiation 2007   BREAST CA  . S/P chemotherapy, time since greater than 12 weeks 2007   BREAST CA  . Schizophrenia, schizoaffective, chronic ([place]) 2004    PAST SURGICAL HISTORY: Past Surgical History:  Procedure Laterality Date  . BREAST LUMPECTOMY Right 2007   right breast 2 areas  . BTL    . CHOLECYSTECTOMY    . COLONOSCOPY  2014  . left breast biopsy    . neck fusion    . TUBAL LIGATION    . UPPER GI ENDOSCOPY  2014    FAMILY HISTORY Family History  Problem Relation Age of Onset  . Heart disease Mother   . Cancer Father        lung  . Diabetes Brother   . Heart disease Brother   . Breast cancer Neg Hx        ADVANCED DIRECTIVES:    HEALTH MAINTENANCE: Social History  Substance Use Topics  . Smoking status: Former Research scientist (life sciences)  . Smokeless tobacco: Not on file  . Alcohol use No     Colonoscopy:  PAP:  Bone density:  Lipid panel:  Allergies  Allergen Reactions  . Other     Patient is allergic to cloves    Current Outpatient Prescriptions  Medication Sig Dispense Refill  . ARIPiprazole (ABILIFY) 15 MG tablet Take 15 mg by mouth daily.    . cetirizine (ZYRTEC) 10 MG tablet Take 10 mg by mouth at bedtime.    . ferrous sulfate  325 (65 FE) MG EC tablet Take 325 mg by mouth daily at 2 PM daily at 2 PM.    . furosemide (LASIX) 40 MG tablet Take 40 mg by mouth daily.    Marland Kitchen levothyroxine (SYNTHROID, LEVOTHROID) 88 MCG tablet Take 88 mcg by mouth daily before breakfast.    . lisinopril (PRINIVIL,ZESTRIL) 5 MG tablet Take 5 mg by mouth daily.    . meclizine (ANTIVERT) 25 MG tablet Take 25 mg by mouth 3 (three) times daily as needed for nausea.    . metFORMIN (GLUCOPHAGE) 500 MG tablet Take 500 mg by mouth 2 (two) times daily with a meal.     . omeprazole (PRILOSEC) 40 MG capsule Take 40 mg by mouth daily.    . potassium chloride (K-DUR,KLOR-CON) 10 MEQ  tablet Take 10 mEq by mouth daily.    . sertraline (ZOLOFT) 100 MG tablet Take 100 mg by mouth daily.     No current facility-administered medications for this visit.     OBJECTIVE: Vitals:   10/07/16 1333  BP: 120/78  Pulse: 84  Resp: 20  Temp: 97.6 F (36.4 C)     Body mass index is 50.12 kg/m.    ECOG FS:0 - Asymptomatic  General: Well-developed, well-nourished, no acute distress. Eyes: Pink conjunctiva, anicteric sclera. Breasts: Bilateral breast and axilla without lumps or masses. Lungs: Clear to auscultation bilaterally. Heart: Regular rate and rhythm. No rubs, murmurs, or gallops. Abdomen: Soft, nontender, nondistended. No organomegaly noted, normoactive bowel sounds. Musculoskeletal: No edema, cyanosis, or clubbing. Neuro: Alert, answering all questions appropriately. Cranial nerves grossly intact. Skin: No rashes or petechiae noted. Psych: Normal affect.  LAB RESULTS:  Lab Results  Component Value Date   NA 138 10/08/2015   K 3.7 10/08/2015   CL 104 10/08/2015   CO2 27 10/08/2015   GLUCOSE 89 10/08/2015   BUN 23 (H) 10/08/2015   CREATININE 0.84 10/08/2015   CALCIUM 9.0 10/08/2015   PROT 7.5 10/08/2015   ALBUMIN 4.5 10/08/2015   AST 26 10/08/2015   ALT 27 10/08/2015   ALKPHOS 78 10/08/2015   BILITOT 0.6 10/08/2015   GFRNONAA >60 10/08/2015   GFRAA >60 10/08/2015    Lab Results  Component Value Date   WBC 8.5 10/08/2015   NEUTROABS 5.6 10/08/2015   HGB 14.6 10/08/2015   HCT 43.7 10/08/2015   MCV 83.5 10/08/2015   PLT 210 10/08/2015     STUDIES: No results found.  ASSESSMENT: Pathologic stage IIa ER/PR positive, HER-2 negative invasive carcinoma of the right breast, unspecified site.    PLAN:    1. Pathologic stage IIa ER/PR positive, HER-2 negative invasive carcinoma of the right breast, unspecified site: Patient was initially diagnosed in February 2008. She completed 5 years of tamoxifen in February 2013 been also recommended to receive  extended adjuvant treatment with Aromasin which she can discontinued in December 2016. Patient's most recent mammogram on January 30, 2016 was reported as BI-RADS 2, repeat in September 2018. Patient is now greater than 10 years removed from her diagnosis and treatment and can be discharged from clinic. Yearly mammograms can now be followed and ordered by her primary care physician. No further intervention is needed.  Approximately 20 minutes was spent in discussion of which greater than 50% was consultation.   Patient expressed understanding and was in agreement with this plan. She also understands that She can call clinic at any time with any questions, concerns, or complaints.   Carcinoma of right breast ([place])  Staging form: Breast, AJCC 7th Edition     Clinical: Stage IIA (yT2, N0, M0) - Unsigned   Lloyd Huger, MD   10/12/2016 1:30 PM

## 2016-10-07 ENCOUNTER — Inpatient Hospital Stay: Payer: Medicaid Other | Attending: Oncology | Admitting: Oncology

## 2016-10-07 ENCOUNTER — Encounter: Payer: Self-pay | Admitting: Oncology

## 2016-10-07 VITALS — BP 120/78 | HR 84 | Temp 97.6°F | Resp 20 | Ht 62.0 in | Wt 274.0 lb

## 2016-10-07 DIAGNOSIS — Z87891 Personal history of nicotine dependence: Secondary | ICD-10-CM | POA: Insufficient documentation

## 2016-10-07 DIAGNOSIS — M1990 Unspecified osteoarthritis, unspecified site: Secondary | ICD-10-CM | POA: Diagnosis not present

## 2016-10-07 DIAGNOSIS — Z853 Personal history of malignant neoplasm of breast: Secondary | ICD-10-CM

## 2016-10-07 DIAGNOSIS — Z7984 Long term (current) use of oral hypoglycemic drugs: Secondary | ICD-10-CM | POA: Insufficient documentation

## 2016-10-07 DIAGNOSIS — C50911 Malignant neoplasm of unspecified site of right female breast: Secondary | ICD-10-CM

## 2016-10-07 DIAGNOSIS — Z79899 Other long term (current) drug therapy: Secondary | ICD-10-CM | POA: Insufficient documentation

## 2016-10-07 DIAGNOSIS — Z170 Estrogen receptor positive status [ER+]: Secondary | ICD-10-CM | POA: Insufficient documentation

## 2016-10-07 DIAGNOSIS — Z9223 Personal history of estrogen therapy: Secondary | ICD-10-CM | POA: Diagnosis not present

## 2016-10-07 DIAGNOSIS — Z85828 Personal history of other malignant neoplasm of skin: Secondary | ICD-10-CM

## 2016-10-07 DIAGNOSIS — Z9221 Personal history of antineoplastic chemotherapy: Secondary | ICD-10-CM | POA: Insufficient documentation

## 2016-10-07 NOTE — Progress Notes (Signed)
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 Pt was prescribed Erivedge 150 mg once daily to tx invasive basel cell. Patient N&V, diarrhea, metallic taste and muscle cramps throughout the entire body. Patient missed 1 dose 1 week ago. Pt states that she is using metal silverware.  Rn advised pt to use plastic ware to see if this improve her symptoms metallic taste. Patient using meclizine for nausea.  Pt states that she "does not want a breast exam today." pt states that "I have pain under my right breast at time. It's a shooting pain that occurs. I never know when it's going to happen, but It's not unbearable."  Pt reports performing self breast exams in shower.

## 2016-12-05 ENCOUNTER — Other Ambulatory Visit: Payer: Self-pay | Admitting: Family Medicine

## 2016-12-05 DIAGNOSIS — Z1239 Encounter for other screening for malignant neoplasm of breast: Secondary | ICD-10-CM

## 2017-02-02 ENCOUNTER — Ambulatory Visit
Admission: RE | Admit: 2017-02-02 | Discharge: 2017-02-02 | Disposition: A | Discharge status: Home / Self Care | Payer: Medicaid Other | Source: Ambulatory Visit | Attending: Family Medicine | Admitting: Family Medicine

## 2017-02-02 DIAGNOSIS — Z1231 Encounter for screening mammogram for malignant neoplasm of breast: Secondary | ICD-10-CM | POA: Diagnosis present

## 2017-02-02 DIAGNOSIS — Z1239 Encounter for other screening for malignant neoplasm of breast: Secondary | ICD-10-CM

## 2017-12-24 ENCOUNTER — Other Ambulatory Visit: Payer: Self-pay | Admitting: Family Medicine

## 2017-12-24 DIAGNOSIS — Z1231 Encounter for screening mammogram for malignant neoplasm of breast: Secondary | ICD-10-CM

## 2018-02-03 ENCOUNTER — Ambulatory Visit
Admission: RE | Admit: 2018-02-03 | Discharge: 2018-02-03 | Disposition: A | Discharge status: Home / Self Care | Payer: Medicaid Other | Source: Ambulatory Visit | Attending: Family Medicine | Admitting: Family Medicine

## 2018-02-03 DIAGNOSIS — Z1231 Encounter for screening mammogram for malignant neoplasm of breast: Secondary | ICD-10-CM

## 2018-12-24 ENCOUNTER — Other Ambulatory Visit: Payer: Self-pay | Admitting: Family Medicine

## 2018-12-27 ENCOUNTER — Other Ambulatory Visit: Payer: Self-pay | Admitting: Family Medicine

## 2018-12-27 DIAGNOSIS — Z1231 Encounter for screening mammogram for malignant neoplasm of breast: Secondary | ICD-10-CM

## 2019-03-16 ENCOUNTER — Ambulatory Visit: Admission: RE | Admit: 2019-03-16 | Payer: Medicaid Other | Source: Ambulatory Visit

## 2019-10-04 ENCOUNTER — Ambulatory Visit
Admission: RE | Admit: 2019-10-04 | Discharge: 2019-10-04 | Disposition: A | Discharge status: Home / Self Care | Payer: Medicaid Other | Source: Ambulatory Visit | Attending: Family Medicine | Admitting: Family Medicine

## 2019-10-04 DIAGNOSIS — Z1231 Encounter for screening mammogram for malignant neoplasm of breast: Secondary | ICD-10-CM | POA: Insufficient documentation

## 2020-08-24 ENCOUNTER — Other Ambulatory Visit: Payer: Self-pay | Admitting: Family Medicine

## 2020-08-24 DIAGNOSIS — Z1231 Encounter for screening mammogram for malignant neoplasm of breast: Secondary | ICD-10-CM

## 2020-11-15 ENCOUNTER — Other Ambulatory Visit: Payer: Self-pay | Admitting: Family Medicine

## 2020-11-15 DIAGNOSIS — Z0000 Encounter for general adult medical examination without abnormal findings: Secondary | ICD-10-CM

## 2020-11-20 ENCOUNTER — Encounter: Payer: Self-pay | Admitting: Family Medicine

## 2020-11-23 ENCOUNTER — Other Ambulatory Visit: Payer: Self-pay

## 2020-11-23 MED ORDER — CLENPIQ 10-3.5-12 MG-GM -GM/160ML PO SOLN: 320.0000 mL | ORAL | 0 refills | Status: DC

## 2020-12-24 ENCOUNTER — Telehealth: Payer: Self-pay

## 2020-12-24 NOTE — Telephone Encounter (Signed)
Patient called this morning stating that for the past 3 days she started to have a rash. Patient stated that she would like to cancel for now since she is not sure if it is contagious. She stated that she would call us once her rash was cleared.

## 2020-12-25 ENCOUNTER — Encounter: Admission: RE | Payer: Self-pay | Source: Home / Self Care

## 2020-12-25 ENCOUNTER — Ambulatory Visit: Admission: RE | Admit: 2020-12-25 | Payer: Medicare Other | Source: Home / Self Care | Admitting: Gastroenterology

## 2020-12-25 SURGERY — COLONOSCOPY
Anesthesia: General

## 2021-02-18 ENCOUNTER — Other Ambulatory Visit: Payer: Self-pay

## 2021-02-18 ENCOUNTER — Ambulatory Visit
Admission: RE | Admit: 2021-02-18 | Discharge: 2021-02-18 | Disposition: A | Discharge status: Home / Self Care | Payer: Medicare Other | Source: Ambulatory Visit | Attending: Family Medicine | Admitting: Family Medicine

## 2021-02-18 DIAGNOSIS — Z1231 Encounter for screening mammogram for malignant neoplasm of breast: Secondary | ICD-10-CM | POA: Diagnosis not present

## 2022-01-13 ENCOUNTER — Other Ambulatory Visit: Payer: Self-pay | Admitting: Family Medicine

## 2022-02-06 ENCOUNTER — Other Ambulatory Visit: Payer: Self-pay | Admitting: Family Medicine

## 2022-02-06 DIAGNOSIS — Z1231 Encounter for screening mammogram for malignant neoplasm of breast: Secondary | ICD-10-CM

## 2022-02-19 ENCOUNTER — Other Ambulatory Visit: Payer: Self-pay | Admitting: *Deleted

## 2022-02-19 ENCOUNTER — Telehealth: Payer: Medicare Other | Admitting: *Deleted

## 2022-02-19 DIAGNOSIS — Z1211 Encounter for screening for malignant neoplasm of colon: Secondary | ICD-10-CM

## 2022-02-19 MED ORDER — NA SULFATE-K SULFATE-MG SULF 17.5-3.13-1.6 GM/177ML PO SOLN: ORAL | 0 refills | Status: DC

## 2022-02-19 NOTE — Telephone Encounter (Signed)
Patient's phone number have been updated in the system.

## 2022-02-19 NOTE — Telephone Encounter (Signed)
Gastroenterology Pre-Procedure Review  Request Date: 03/18/2022 Requesting Physician: Dr. Vicente Males  PATIENT REVIEW QUESTIONS: The patient responded to the following health history questions as indicated:    1. Are you having any GI issues? no 2. Do you have a personal history of Polyps? no 3. Do you have a family history of Colon Cancer or Polyps? no 4. Diabetes Mellitus? yes (Type 2 DM - taking metfomin, Victoza, Lantus) 5. Joint replacements in the past 12 months?no 6. Major health problems in the past 3 months?no 7. Any artificial heart valves, MVP, or defibrillator?no    MEDICATIONS & ALLERGIES:    Patient reports the following regarding taking any anticoagulation/antiplatelet therapy:   Plavix, Coumadin, Eliquis, Xarelto, Lovenox, Pradaxa, Brilinta, or Effient? no Aspirin? no  Patient confirms/reports the following medications:  Current Outpatient Medications  Medication Sig Dispense Refill   ARIPiprazole (ABILIFY) 15 MG tablet Take 15 mg by mouth daily.     cetirizine (ZYRTEC) 10 MG tablet Take 10 mg by mouth at bedtime.     ferrous sulfate 325 (65 FE) MG EC tablet Take 325 mg by mouth daily at 2 PM daily at 2 PM.     furosemide (LASIX) 40 MG tablet Take 40 mg by mouth daily.     insulin glargine (LANTUS) 100 UNIT/ML injection Inject 85 Units into the skin at bedtime.     levothyroxine (SYNTHROID, LEVOTHROID) 88 MCG tablet Take 88 mcg by mouth daily before breakfast.     liraglutide (VICTOZA) 18 MG/3ML SOPN Inject 18 mg into the skin daily at 6 (six) AM.     lisinopril (PRINIVIL,ZESTRIL) 5 MG tablet Take 5 mg by mouth daily.     meclizine (ANTIVERT) 25 MG tablet Take 25 mg by mouth 3 (three) times daily as needed for nausea.     metFORMIN (GLUCOPHAGE) 500 MG tablet Take 500 mg by mouth 2 (two) times daily with a meal.      omeprazole (PRILOSEC) 40 MG capsule Take 40 mg by mouth daily.     potassium chloride (K-DUR,KLOR-CON) 10 MEQ tablet Take 10 mEq by mouth daily.     sertraline  (ZOLOFT) 100 MG tablet Take 100 mg by mouth daily.     Sod Picosulfate-Mag Ox-Cit Acd (CLENPIQ) 10-3.5-12 MG-GM -GM/160ML SOLN Take 320 mLs by mouth as directed. 320 mL 0   No current facility-administered medications for this visit.    Patient confirms/reports the following allergies:  Allergies  Allergen Reactions   Other     Patient is allergic to cloves    No orders of the defined types were placed in this encounter.   AUTHORIZATION INFORMATION Primary Insurance: 1D#: Group #:  Secondary Insurance: 1D#: Group #:  SCHEDULE INFORMATION: Date: 03/18/2022 Time: Location:[facility]

## 2022-03-10 ENCOUNTER — Ambulatory Visit
Admission: RE | Admit: 2022-03-10 | Discharge: 2022-03-10 | Disposition: A | Discharge status: Home / Self Care | Payer: Medicare Other | Source: Ambulatory Visit | Attending: Family Medicine | Admitting: Family Medicine

## 2022-03-10 DIAGNOSIS — Z1231 Encounter for screening mammogram for malignant neoplasm of breast: Secondary | ICD-10-CM | POA: Diagnosis present

## 2022-03-18 ENCOUNTER — Ambulatory Visit: Payer: Medicare Other | Admitting: Certified Registered Nurse Anesthetist

## 2022-03-18 ENCOUNTER — Encounter
Admission: RE | Disposition: A | Discharge status: Home / Self Care | Payer: Self-pay | Source: Home / Self Care | Attending: Gastroenterology

## 2022-03-18 ENCOUNTER — Ambulatory Visit
Admission: RE | Admit: 2022-03-18 | Discharge: 2022-03-18 | Disposition: A | Discharge status: Home / Self Care | Payer: Medicare Other | Attending: Gastroenterology | Admitting: Gastroenterology

## 2022-03-18 ENCOUNTER — Encounter: Payer: Self-pay | Admitting: Gastroenterology

## 2022-03-18 DIAGNOSIS — Z9221 Personal history of antineoplastic chemotherapy: Secondary | ICD-10-CM | POA: Diagnosis not present

## 2022-03-18 DIAGNOSIS — D124 Benign neoplasm of descending colon: Secondary | ICD-10-CM | POA: Insufficient documentation

## 2022-03-18 DIAGNOSIS — Z853 Personal history of malignant neoplasm of breast: Secondary | ICD-10-CM | POA: Diagnosis not present

## 2022-03-18 DIAGNOSIS — Z85828 Personal history of other malignant neoplasm of skin: Secondary | ICD-10-CM | POA: Insufficient documentation

## 2022-03-18 DIAGNOSIS — D122 Benign neoplasm of ascending colon: Secondary | ICD-10-CM | POA: Insufficient documentation

## 2022-03-18 DIAGNOSIS — D128 Benign neoplasm of rectum: Secondary | ICD-10-CM | POA: Diagnosis not present

## 2022-03-18 DIAGNOSIS — I110 Hypertensive heart disease with heart failure: Secondary | ICD-10-CM | POA: Insufficient documentation

## 2022-03-18 DIAGNOSIS — Z1211 Encounter for screening for malignant neoplasm of colon: Secondary | ICD-10-CM | POA: Insufficient documentation

## 2022-03-18 DIAGNOSIS — D126 Benign neoplasm of colon, unspecified: Secondary | ICD-10-CM | POA: Diagnosis not present

## 2022-03-18 DIAGNOSIS — G4730 Sleep apnea, unspecified: Secondary | ICD-10-CM | POA: Diagnosis not present

## 2022-03-18 DIAGNOSIS — Z87891 Personal history of nicotine dependence: Secondary | ICD-10-CM | POA: Diagnosis not present

## 2022-03-18 DIAGNOSIS — D120 Benign neoplasm of cecum: Secondary | ICD-10-CM | POA: Insufficient documentation

## 2022-03-18 DIAGNOSIS — I509 Heart failure, unspecified: Secondary | ICD-10-CM | POA: Diagnosis not present

## 2022-03-18 DIAGNOSIS — Z923 Personal history of irradiation: Secondary | ICD-10-CM | POA: Diagnosis not present

## 2022-03-18 DIAGNOSIS — Z6841 Body Mass Index (BMI) 40.0 and over, adult: Secondary | ICD-10-CM | POA: Diagnosis not present

## 2022-03-18 HISTORY — DX: Sleep apnea, unspecified: G47.30

## 2022-03-18 HISTORY — PX: COLONOSCOPY WITH PROPOFOL: SHX5780

## 2022-03-18 HISTORY — DX: Heart failure, unspecified: I50.9

## 2022-03-18 HISTORY — DX: Essential (primary) hypertension: I10

## 2022-03-18 LAB — GLUCOSE, CAPILLARY: Glucose-Capillary: 173 mg/dL — ABNORMAL HIGH (ref 70–99)

## 2022-03-18 SURGERY — COLONOSCOPY WITH PROPOFOL
Anesthesia: General

## 2022-03-18 MED ORDER — STERILE WATER FOR IRRIGATION IR SOLN
Status: DC | PRN
  Administered 2022-03-18: 120 mL

## 2022-03-18 MED ORDER — PROPOFOL 500 MG/50ML IV EMUL
INTRAVENOUS | Status: DC | PRN
  Administered 2022-03-18: 160 ug/kg/min via INTRAVENOUS

## 2022-03-18 MED ORDER — PROPOFOL 10 MG/ML IV BOLUS
INTRAVENOUS | Status: DC | PRN
  Administered 2022-03-18: 70 mg via INTRAVENOUS
  Administered 2022-03-18: 10 mg via INTRAVENOUS

## 2022-03-18 MED ORDER — SODIUM CHLORIDE 0.9 % IV SOLN: INTRAVENOUS | Status: DC

## 2022-03-18 NOTE — Anesthesia Procedure Notes (Signed)
Date/Time: 03/18/2022 10:01 AM  Performed by: Demetrius Charity, CRNAPre-anesthesia Checklist: Patient identified, Emergency Drugs available, Suction available, Patient being monitored and Timeout performed Patient Re-evaluated:Patient Re-evaluated prior to induction Oxygen Delivery Method: Supernova nasal CPAP Induction Type: IV induction Placement Confirmation: CO2 detector and positive ETCO2

## 2022-03-18 NOTE — Anesthesia Postprocedure Evaluation (Signed)
Anesthesia Post Note  Patient: Amy Buchanan  Procedure(s) Performed: COLONOSCOPY WITH PROPOFOL  Patient location during evaluation: Endoscopy Anesthesia Type: General Level of consciousness: awake and alert Pain management: pain level controlled Vital Signs Assessment: post-procedure vital signs reviewed and stable Respiratory status: spontaneous breathing, nonlabored ventilation, respiratory function stable and patient connected to nasal cannula oxygen Cardiovascular status: blood pressure returned to baseline and stable Postop Assessment: no apparent nausea or vomiting Anesthetic complications: no   No notable events documented.   Last Vitals:  Vitals:   03/18/22 1027 03/18/22 1049  BP: (!) 148/59 (!) 174/85  Pulse: 94 89  Resp: (!) 24 17  Temp: (!) 35.6 C   SpO2: 100% 100%    Last Pain:  Vitals:   03/18/22 1049  TempSrc:   PainSc: 0-No pain                 Arita Miss

## 2022-03-18 NOTE — Op Note (Signed)
[facility] Gastroenterology Patient Name: Amy Buchanan Procedure Date: 03/18/2022 9:49 AM MRN: [id number] Account #: [id number] Date of Birth: 1955-07-21 Admit Type: Outpatient Age: 66 Room: [facility] ENDO ROOM 2 Gender: Female Note Status: Finalized Instrument Name: Park Meo [id number] Procedure:             Colonoscopy Indications:           Screening for colorectal malignant neoplasm Providers:             Jonathon Bellows MD, MD Referring MD:          Baxter Kail. Rebeca Alert MD, MD (Referring MD) Medicines:             Monitored Anesthesia Care Complications:         No immediate complications. Procedure:             Pre-Anesthesia Assessment:                        - Prior to the procedure, a History and Physical was                         performed, and patient medications, allergies and                         sensitivities were reviewed. The patient's tolerance                         of previous anesthesia was reviewed.                        - The risks and benefits of the procedure and the                         sedation options and risks were discussed with the                         patient. All questions were answered and informed                         consent was obtained.                        - ASA Grade Assessment: II - A patient with mild                         systemic disease.                        - After reviewing the risks and benefits, the patient                         was deemed in satisfactory condition to undergo the                         procedure.                        After obtaining informed consent, the colonoscope was                         passed under direct  vision. Throughout the procedure,                         the patient's blood pressure, pulse, and oxygen                         saturations were monitored continuously. The                         Colonoscope was introduced through the anus and                          advanced to the the cecum, identified by the                         appendiceal orifice. The colonoscopy was performed                         without difficulty. The patient tolerated the                         procedure well. The quality of the bowel preparation                         was good. The appendiceal orifice was photographed. Findings:      The perianal and digital rectal examinations were normal.      Two sessile polyps were found in the rectum. The polyps were 4 to 6 mm       in size. These polyps were removed with a cold snare. Resection and       retrieval were complete.      A 5 mm polyp was found in the descending colon. The polyp was sessile.       The polyp was removed with a cold snare. Resection and retrieval were       complete.      A 3 mm polyp was found in the cecum. The polyp was sessile. The polyp       was removed with a cold biopsy forceps. Resection and retrieval were       complete.      Eight sessile polyps were found in the ascending colon. The polyps were       6 to 9 mm in size. These polyps were removed with a cold snare.       Resection and retrieval were complete. To prevent bleeding after the       polypectomy, one hemostatic clip was successfully placed. There was no       bleeding at the end of the procedure.      The exam was otherwise without abnormality on direct and retroflexion       views. Impression:            - Two 4 to 6 mm polyps in the rectum, removed with a                         cold snare. Resected and retrieved.                        - One 5 mm polyp in the descending colon, removed with  a cold snare. Resected and retrieved.                        - One 3 mm polyp in the cecum, removed with a cold                         biopsy forceps. Resected and retrieved.                        - Eight 6 to 9 mm polyps in the ascending colon,                         removed with a cold snare. Resected and  retrieved.                         Clip was placed.                        - The examination was otherwise normal on direct and                         retroflexion views. Recommendation:        - Discharge patient to home (with escort).                        - Resume previous diet.                        - Continue present medications.                        - Await pathology results.                        - Repeat colonoscopy in 1 year for surveillance. Procedure Code(s):     --- Professional ---                        [phone], Colonoscopy, flexible; with removal of                         tumor(s), polyp(s), or other lesion(s) by snare                         technique                        45380, 55, Colonoscopy, flexible; with biopsy, single                         or multiple Diagnosis Code(s):     --- Professional ---                        Z12.11, Encounter for screening for malignant neoplasm                         of colon                        D12.8, Benign neoplasm of rectum  D12.2, Benign neoplasm of ascending colon                        D12.4, Benign neoplasm of descending colon                        D12.0, Benign neoplasm of cecum CPT copyright 2022 American Medical Association. All rights reserved. The codes documented in this report are preliminary and upon coder review may  be revised to meet current compliance requirements. Jonathon Bellows, MD Jonathon Bellows MD, MD 03/18/2022 10:25:04 AM This report has been signed electronically. Number of Addenda: 0 Note Initiated On: 03/18/2022 9:49 AM Scope Withdrawal Time: 0 hours 15 minutes 50 seconds  Total Procedure Duration: 0 hours 18 minutes 17 seconds  Estimated Blood Loss:  Estimated blood loss: none.      [facility]

## 2022-03-18 NOTE — Anesthesia Preprocedure Evaluation (Signed)
Anesthesia Evaluation  Patient identified by MRN, date of birth, ID band Patient awake    Reviewed: Allergy & Precautions, NPO status , Patient's Chart, lab work & pertinent test results  History of Anesthesia Complications Negative for: history of anesthetic complications  Airway Mallampati: III  TM Distance: >3 FB Neck ROM: Full    Dental no notable dental hx. (+) Teeth Intact   Pulmonary sleep apnea and Continuous Positive Airway Pressure Ventilation , neg COPD, Patient abstained from smoking.Not current smoker, former smoker   Pulmonary exam normal breath sounds clear to auscultation       Cardiovascular Exercise Tolerance: Good METShypertension, Pt. on medications +CHF  (-) CAD and (-) Past MI (-) dysrhythmias  Rhythm:Regular Rate:Normal - Systolic murmurs    Neuro/Psych  PSYCHIATRIC DISORDERS    Schizophrenia  negative neurological ROS     GI/Hepatic ,neg GERD  ,,(+)     (-) substance abuse    Endo/Other  neg diabetes  Morbid obesity  Renal/GU negative Renal ROS     Musculoskeletal  (+) Arthritis ,    Abdominal   Peds  Hematology  (+) Blood dyscrasia, anemia   Anesthesia Other Findings Past Medical History: No date: Allergy No date: Anemia No date: Arthritis No date: Basal cell carcinoma (BCC) of eyelid 2007: Breast cancer ([place])     Comment:  RT LUMPECTOMY 2007: Carcinoma of right breast ([place])     Comment:  T2, N0. ER positive, PR positive, HER-2/neu not               overexpressing. Tamoxifen ending 2013, Aromasin started               2013. No date: CHF (congestive heart failure) (HCC) No date: Clotting disorder ([place]) No date: Edema     Comment:  lower extremity No date: Gout No date: Hemorrhoids No date: Hypertension No date: Obesity 2007: Radiation     Comment:  BREAST CA 2007: S/P chemotherapy, time since greater than 12 weeks     Comment:  BREAST CA 2004: Schizophrenia,  schizoaffective, chronic (HCC) No date: Sleep apnea  Reproductive/Obstetrics                             Anesthesia Physical Anesthesia Plan  ASA: 3  Anesthesia Plan: General   Post-op Pain Management: Minimal or no pain anticipated   Induction: Intravenous  PONV Risk Score and Plan: 3 and Propofol infusion, TIVA and Ondansetron  Airway Management Planned: Nasal Cannula  Additional Equipment: None  Intra-op Plan:   Post-operative Plan:   Informed Consent: I have reviewed the patients History and Physical, chart, labs and discussed the procedure including the risks, benefits and alternatives for the proposed anesthesia with the patient or authorized representative who has indicated his/her understanding and acceptance.     Dental advisory given  Plan Discussed with: CRNA and Surgeon  Anesthesia Plan Comments: (Discussed risks of anesthesia with patient, including possibility of difficulty with spontaneous ventilation under anesthesia necessitating airway intervention, PONV, and rare risks such as cardiac or respiratory or neurological events, and allergic reactions. Discussed the role of CRNA in patient's perioperative care. Patient understands. Patient informed about increased incidence of above perioperative risk due to high BMI. Patient understands.  )       Anesthesia Quick Evaluation

## 2022-03-18 NOTE — Transfer of Care (Signed)
Immediate Anesthesia Transfer of Care Note  Patient: Amy Buchanan  Procedure(s) Performed: COLONOSCOPY WITH PROPOFOL  Patient Location: PACU  Anesthesia Type:General  Level of Consciousness: awake, alert , and oriented  Airway & Oxygen Therapy: Patient Spontanous Breathing  Post-op Assessment: Report given to RN and Post -op Vital signs reviewed and stable  Post vital signs: Reviewed and stable  Last Vitals:  Vitals Value Taken Time  BP    Temp    Pulse    Resp    SpO2      Last Pain:  Vitals:   03/18/22 0908  TempSrc: Temporal         Complications: No notable events documented.

## 2022-03-18 NOTE — H&P (Signed)
Amy Bellows, MD [address], [place], [place], [place], [address] [address], [place], [place], [place], [address] Phone: [phone]  Fax: [phone]  Primary Care Physician:  Letta Median, MD   Pre-Procedure History & Physical: HPI:  Amy Buchanan is a 66 y.o. female is here for an colonoscopy.   Past Medical History:  Diagnosis Date   Allergy    Anemia    Arthritis    Basal cell carcinoma (BCC) of eyelid    Breast cancer ([place]) 2007   RT LUMPECTOMY   Carcinoma of right breast ([place]) 2007   T2, N0. ER positive, PR positive, HER-2/neu not overexpressing. Tamoxifen ending 2013, Aromasin started 2013.   CHF (congestive heart failure) (HCC)    Clotting disorder ([place])    Edema    lower extremity   Gout    Hemorrhoids    Hypertension    Obesity    Radiation 2007   BREAST CA   S/P chemotherapy, time since greater than 12 weeks 2007   BREAST CA   Schizophrenia, schizoaffective, chronic ([place]) 2004   Sleep apnea     Past Surgical History:  Procedure Laterality Date   BREAST LUMPECTOMY Right 2007   right breast 2 areas   BTL     CHOLECYSTECTOMY     COLONOSCOPY  2014   left breast biopsy     neck fusion     TUBAL LIGATION     UPPER GI ENDOSCOPY  2014    Prior to Admission medications   Medication Sig Start Date End Date Taking? Authorizing Provider  ARIPiprazole (ABILIFY) 15 MG tablet Take 15 mg by mouth daily.   Yes [provider]  ferrous sulfate 325 (65 FE) MG EC tablet Take 325 mg by mouth daily at 2 PM daily at 2 PM.   Yes [provider]  furosemide (LASIX) 40 MG tablet Take 40 mg by mouth daily.   Yes [provider]  insulin glargine (LANTUS) 100 UNIT/ML injection Inject 85 Units into the skin at bedtime.   Yes [provider]  levothyroxine (SYNTHROID, LEVOTHROID) 88 MCG tablet Take 88 mcg by mouth daily before breakfast.   Yes [provider]  lisinopril (PRINIVIL,ZESTRIL) 5 MG tablet Take  5 mg by mouth daily.   Yes [provider]  Na Sulfate-K Sulfate-Mg Sulf 17.5-3.13-1.6 GM/177ML SOLN At 5:00 pm the evening before the procedure: Pour the contents of one bottle of Suprep into the mixing container provided.  Fill the container with cold water to the fill line and mix. DRINK all the liquid in the container. You must drink two (2) more 16 ounces containers of water over the next 1 hour. On the day of the procedure, 5 hours before procedure: Pour the contents of the second bottle of Suprep into the mixing container provided and follow the same instructions. 02/19/22  Yes Amy Bellows, MD  omeprazole (PRILOSEC) 40 MG capsule Take 40 mg by mouth daily.   Yes [provider]  potassium chloride (K-DUR,KLOR-CON) 10 MEQ tablet Take 10 mEq by mouth daily.   Yes [provider]  sertraline (ZOLOFT) 100 MG tablet Take 100 mg by mouth daily.   Yes [provider]  cetirizine (ZYRTEC) 10 MG tablet Take 10 mg by mouth at bedtime.    [provider]  liraglutide (VICTOZA) 18 MG/3ML SOPN Inject 18 mg into the skin daily at 6 (six) AM.    [provider]  meclizine (  ANTIVERT) 25 MG tablet Take 25 mg by mouth 3 (three) times daily as needed for nausea.    [provider]  metFORMIN (GLUCOPHAGE) 500 MG tablet Take 500 mg by mouth 2 (two) times daily with a meal.     [provider]    Allergies as of 02/19/2022 - Review Complete 10/07/2016  Allergen Reaction Noted   Other  08/17/2014    Family History  Problem Relation Age of Onset   Heart disease Mother    Cancer Father        lung   Diabetes Brother    Heart disease Brother    Breast cancer Neg Hx     Social History   Socioeconomic History   Marital status: Married    Spouse name: Not on file   Number of children: Not on file   Years of education: Not on file   Highest education level: Not on file  Occupational History   Not on file  Tobacco Use   Smoking  status: Former   Smokeless tobacco: Not on file  Vaping Use   Vaping Use: Never used  Substance and Sexual Activity   Alcohol use: No    Alcohol/week: 0.0 standard drinks of alcohol   Drug use: No   Sexual activity: Not on file  Other Topics Concern   Not on file  Social History Narrative   Not on file   Social Determinants of Health   Financial Resource Strain: Not on file  Food Insecurity: Not on file  Transportation Needs: Not on file  Physical Activity: Not on file  Stress: Not on file  Social Connections: Not on file  Intimate Partner Violence: Not on file    Review of Systems: See HPI, otherwise negative ROS  Physical Exam: BP (!) 178/80   Pulse 84   Temp (!) 96.8 F (36 C) (Temporal)   Resp 18   Ht 5' 1.5" (1.562 m)   Wt 126.6 kg   SpO2 100%   BMI 51.86 kg/m  General:   Alert,  pleasant and cooperative in NAD Head:  Normocephalic and atraumatic. Neck:  Supple; no masses or thyromegaly. Lungs:  Clear throughout to auscultation, normal respiratory effort.    Heart:  +S1, +S2, Regular rate and rhythm, No edema. Abdomen:  Soft, nontender and nondistended. Normal bowel sounds, without guarding, and without rebound.   Neurologic:  Alert and  oriented x4;  grossly normal neurologically.  Impression/Plan: SHEWANDA Buchanan is here for an colonoscopy to be performed for Screening colonoscopy average risk   Risks, benefits, limitations, and alternatives regarding  colonoscopy have been reviewed with the patient.  Questions have been answered.  All parties agreeable.   Amy Bellows, MD  03/18/2022, 9:49 AM

## 2022-03-19 ENCOUNTER — Encounter: Payer: Self-pay | Admitting: Gastroenterology

## 2022-03-19 LAB — SURGICAL PATHOLOGY

## 2022-08-06 ENCOUNTER — Ambulatory Visit
Admission: RE | Admit: 2022-08-06 | Discharge: 2022-08-06 | Disposition: A | Discharge status: Home / Self Care | Payer: 59 | Attending: Physician Assistant | Admitting: Physician Assistant

## 2022-08-06 ENCOUNTER — Ambulatory Visit
Admission: RE | Admit: 2022-08-06 | Discharge: 2022-08-06 | Disposition: A | Discharge status: Home / Self Care | Payer: 59 | Source: Ambulatory Visit | Attending: Physician Assistant | Admitting: Physician Assistant

## 2022-08-06 ENCOUNTER — Other Ambulatory Visit: Payer: Self-pay | Admitting: Physician Assistant

## 2022-08-06 DIAGNOSIS — M25561 Pain in right knee: Secondary | ICD-10-CM

## 2023-01-30 ENCOUNTER — Other Ambulatory Visit: Payer: Self-pay | Admitting: Family Medicine

## 2023-01-30 DIAGNOSIS — Z1231 Encounter for screening mammogram for malignant neoplasm of breast: Secondary | ICD-10-CM

## 2023-02-02 ENCOUNTER — Telehealth: Payer: Self-pay

## 2023-02-02 NOTE — Telephone Encounter (Signed)
Patient is calling to schedule a appointment with Dr. Tobi Bastos because she is having GI issues. Dr. Tobi Bastos has never seen her in the office just a colonoscopy informed her we would need a referral referring her to our office. She states she will get this done

## 2023-02-10 ENCOUNTER — Other Ambulatory Visit: Payer: Self-pay | Admitting: *Deleted

## 2023-02-10 ENCOUNTER — Telehealth: Payer: Self-pay | Admitting: *Deleted

## 2023-02-10 ENCOUNTER — Telehealth: Payer: Self-pay | Admitting: Gastroenterology

## 2023-02-10 DIAGNOSIS — Z860100 Personal history of colon polyps, unspecified: Secondary | ICD-10-CM

## 2023-02-10 MED ORDER — PEG 3350-KCL-NABCB-NACL-NASULF 236 G PO SOLR: 4000.0000 mL | Freq: Once | ORAL | 0 refills | Status: AC

## 2023-02-10 NOTE — Telephone Encounter (Signed)
Colonoscopy have been schedule for 03/23/2023 with Dr Tobi Bastos

## 2023-02-10 NOTE — Telephone Encounter (Signed)
Gastroenterology Pre-Procedure Review  Request Date: 03/23/2023 Requesting Physician: Dr. Tobi Bastos  PATIENT REVIEW QUESTIONS: The patient responded to the following health history questions as indicated:    1. Are you having any GI issues? no 2. Do you have a personal history of Polyps? yes (03/18/2022) 3. Do you have a family history of Colon Cancer or Polyps? no 4. Diabetes Mellitus? yes (taking metformin, Victoza, and Lantus) 5. Joint replacements in the past 12 months?no 6. Major health problems in the past 3 months?no 7. Any artificial heart valves, MVP, or defibrillator?no    MEDICATIONS & ALLERGIES:    Patient reports the following regarding taking any anticoagulation/antiplatelet therapy:   Plavix, Coumadin, Eliquis, Xarelto, Lovenox, Pradaxa, Brilinta, or Effient? no Aspirin? no  Patient confirms/reports the following medications:  Current Outpatient Medications  Medication Sig Dispense Refill   ARIPiprazole (ABILIFY) 15 MG tablet Take 15 mg by mouth daily.     cetirizine (ZYRTEC) 10 MG tablet Take 10 mg by mouth at bedtime.     ferrous sulfate 325 (65 FE) MG EC tablet Take 325 mg by mouth daily at 2 PM daily at 2 PM.     furosemide (LASIX) 40 MG tablet Take 40 mg by mouth daily.     insulin glargine (LANTUS) 100 UNIT/ML injection Inject 85 Units into the skin at bedtime.     levothyroxine (SYNTHROID, LEVOTHROID) 88 MCG tablet Take 88 mcg by mouth daily before breakfast.     liraglutide (VICTOZA) 18 MG/3ML SOPN Inject 18 mg into the skin daily at 6 (six) AM.     lisinopril (PRINIVIL,ZESTRIL) 5 MG tablet Take 5 mg by mouth daily.     meclizine (ANTIVERT) 25 MG tablet Take 25 mg by mouth 3 (three) times daily as needed for nausea.     metFORMIN (GLUCOPHAGE) 500 MG tablet Take 500 mg by mouth 2 (two) times daily with a meal.      Na Sulfate-K Sulfate-Mg Sulf 17.5-3.13-1.6 GM/177ML SOLN At 5:00 pm the evening before the procedure: Pour the contents of one bottle of Suprep into the  mixing container provided.  Fill the container with cold water to the fill line and mix. DRINK all the liquid in the container. You must drink two (2) more 16 ounces containers of water over the next 1 hour. On the day of the procedure, 5 hours before procedure: Pour the contents of the second bottle of Suprep into the mixing container provided and follow the same instructions. 354 mL 0   omeprazole (PRILOSEC) 40 MG capsule Take 40 mg by mouth daily.     potassium chloride (K-DUR,KLOR-CON) 10 MEQ tablet Take 10 mEq by mouth daily.     sertraline (ZOLOFT) 100 MG tablet Take 100 mg by mouth daily.     No current facility-administered medications for this visit.    Patient confirms/reports the following allergies:  Allergies  Allergen Reactions   Other     Patient is allergic to cloves    No orders of the defined types were placed in this encounter.   AUTHORIZATION INFORMATION Primary Insurance: 1D#: Group #:  Secondary Insurance: 1D#: Group #:  SCHEDULE INFORMATION: Date: 03/23/2023 Time: Location:  [facility]

## 2023-02-10 NOTE — Telephone Encounter (Signed)
Patient called in to schedule her colonoscopy. Per Dr. Tobi Bastos I recommend you have a repeat colonoscopy in 1 years to determine if you have developed any new polyps and to screen for colorectal cancer.

## 2023-03-13 ENCOUNTER — Ambulatory Visit
Admission: RE | Admit: 2023-03-13 | Discharge: 2023-03-13 | Disposition: A | Discharge status: Home / Self Care | Payer: 59 | Source: Ambulatory Visit | Attending: Family Medicine | Admitting: Family Medicine

## 2023-03-13 DIAGNOSIS — Z1231 Encounter for screening mammogram for malignant neoplasm of breast: Secondary | ICD-10-CM | POA: Diagnosis present

## 2023-03-23 ENCOUNTER — Encounter: Payer: Self-pay | Admitting: Anesthesiology

## 2023-03-23 ENCOUNTER — Telehealth: Payer: Self-pay | Admitting: *Deleted

## 2023-03-23 ENCOUNTER — Ambulatory Visit
Admission: RE | Admit: 2023-03-23 | Discharge: 2023-03-23 | Disposition: A | Discharge status: Home / Self Care | Payer: 59 | Attending: Gastroenterology | Admitting: Gastroenterology

## 2023-03-23 ENCOUNTER — Other Ambulatory Visit: Payer: Self-pay | Admitting: *Deleted

## 2023-03-23 ENCOUNTER — Encounter
Admission: RE | Disposition: A | Discharge status: Home / Self Care | Payer: Self-pay | Source: Home / Self Care | Attending: Gastroenterology

## 2023-03-23 DIAGNOSIS — Z1211 Encounter for screening for malignant neoplasm of colon: Secondary | ICD-10-CM

## 2023-03-23 DIAGNOSIS — Z538 Procedure and treatment not carried out for other reasons: Secondary | ICD-10-CM | POA: Insufficient documentation

## 2023-03-23 DIAGNOSIS — Z860101 Personal history of adenomatous and serrated colon polyps: Secondary | ICD-10-CM | POA: Diagnosis present

## 2023-03-23 DIAGNOSIS — Z860100 Personal history of colon polyps, unspecified: Secondary | ICD-10-CM

## 2023-03-23 HISTORY — PX: COLONOSCOPY WITH PROPOFOL: SHX5780

## 2023-03-23 SURGERY — COLONOSCOPY WITH PROPOFOL
Anesthesia: General

## 2023-03-23 MED ORDER — PEG 3350-KCL-NABCB-NACL-NASULF 236 G PO SOLR: 8000.0000 mL | Freq: Once | ORAL | 0 refills | Status: AC

## 2023-03-23 MED ORDER — SODIUM CHLORIDE 0.9 % IV SOLN: INTRAVENOUS | Status: DC

## 2023-03-23 NOTE — Telephone Encounter (Signed)
Message left for patient to return my call.  Received a message from Dr Tobi Bastos that patient was not cleaned out for procedure today, reschedule with 2 day prep

## 2023-03-23 NOTE — Anesthesia Preprocedure Evaluation (Addendum)
Anesthesia Evaluation  Patient identified by MRN, date of birth, ID band Patient awake    Reviewed: Allergy & Precautions, NPO status , Patient's Chart, lab work & pertinent test results  History of Anesthesia Complications Negative for: history of anesthetic complications  Airway Mallampati: III  TM Distance: >3 FB Neck ROM: Full    Dental no notable dental hx. (+) Teeth Intact   Pulmonary sleep apnea and Continuous Positive Airway Pressure Ventilation , neg COPD, Patient abstained from smoking.Not current smoker, former smoker   Pulmonary exam normal breath sounds clear to auscultation       Cardiovascular Exercise Tolerance: Good METShypertension, Pt. on medications +CHF  (-) CAD and (-) Past MI (-) dysrhythmias  Rhythm:Regular Rate:Normal - Systolic murmurs    Neuro/Psych  PSYCHIATRIC DISORDERS    Schizophrenia  negative neurological ROS     GI/Hepatic ,neg GERD  ,,(+)     (-) substance abuse    Endo/Other  neg diabetes  Class 4 obesity  Renal/GU negative Renal ROS     Musculoskeletal  (+) Arthritis ,    Abdominal   Peds  Hematology  (+) Blood dyscrasia, anemia   Anesthesia Other Findings Past Medical History: No date: Allergy No date: Anemia No date: Arthritis No date: Basal cell carcinoma (BCC) of eyelid 2007: Breast cancer (HCC)     Comment:  RT LUMPECTOMY 2007: Carcinoma of right breast (HCC)     Comment:  T2, N0. ER positive, PR positive, HER-2/neu not               overexpressing. Tamoxifen ending 2013, Aromasin started               2013. No date: CHF (congestive heart failure) (HCC) No date: Clotting disorder (HCC) No date: Edema     Comment:  lower extremity No date: Gout No date: Hemorrhoids No date: Hypertension No date: Obesity 2007: Radiation     Comment:  BREAST CA 2007: S/P chemotherapy, time since greater than 12 weeks     Comment:  BREAST CA 2004: Schizophrenia,  schizoaffective, chronic (HCC) No date: Sleep apnea  Reproductive/Obstetrics                              Anesthesia Physical Anesthesia Plan  ASA: 3  Anesthesia Plan: General   Post-op Pain Management: Minimal or no pain anticipated   Induction: Intravenous  PONV Risk Score and Plan: 3 and Propofol infusion, TIVA and Ondansetron  Airway Management Planned: Nasal Cannula  Additional Equipment: None  Intra-op Plan:   Post-operative Plan:   Informed Consent: I have reviewed the patients History and Physical, chart, labs and discussed the procedure including the risks, benefits and alternatives for the proposed anesthesia with the patient or authorized representative who has indicated his/her understanding and acceptance.     Dental advisory given  Plan Discussed with: CRNA and Surgeon  Anesthesia Plan Comments: (Discussed risks of anesthesia with patient, including possibility of difficulty with spontaneous ventilation under anesthesia necessitating airway intervention, PONV, and rare risks such as cardiac or respiratory or neurological events, and allergic reactions. Discussed the role of CRNA in patient's perioperative care. Patient understands. Patient informed about increased incidence of above perioperative risk due to high BMI. Patient understands.  )        Anesthesia Quick Evaluation

## 2023-03-23 NOTE — Telephone Encounter (Signed)
Patient called back. Reschedule colonoscopy to 04/08/2023

## 2023-03-23 NOTE — OR Nursing (Signed)
Patient not clean from the prep. Dr Tobi Bastos will have his office call her to reschedule and do a 2 day prep

## 2023-03-24 ENCOUNTER — Encounter: Payer: Self-pay | Admitting: Gastroenterology

## 2023-03-31 ENCOUNTER — Encounter: Payer: Self-pay | Admitting: Gastroenterology

## 2023-04-08 ENCOUNTER — Ambulatory Visit
Admission: RE | Admit: 2023-04-08 | Discharge: 2023-04-08 | Disposition: A | Discharge status: Home / Self Care | Payer: 59 | Attending: Gastroenterology | Admitting: Gastroenterology

## 2023-04-08 ENCOUNTER — Encounter
Admission: RE | Disposition: A | Discharge status: Home / Self Care | Payer: Self-pay | Source: Home / Self Care | Attending: Gastroenterology

## 2023-04-08 ENCOUNTER — Ambulatory Visit: Payer: 59 | Admitting: Anesthesiology

## 2023-04-08 DIAGNOSIS — Z860101 Personal history of adenomatous and serrated colon polyps: Secondary | ICD-10-CM | POA: Insufficient documentation

## 2023-04-08 DIAGNOSIS — I509 Heart failure, unspecified: Secondary | ICD-10-CM | POA: Insufficient documentation

## 2023-04-08 DIAGNOSIS — Z7984 Long term (current) use of oral hypoglycemic drugs: Secondary | ICD-10-CM | POA: Diagnosis not present

## 2023-04-08 DIAGNOSIS — Z833 Family history of diabetes mellitus: Secondary | ICD-10-CM | POA: Insufficient documentation

## 2023-04-08 DIAGNOSIS — Z79899 Other long term (current) drug therapy: Secondary | ICD-10-CM | POA: Insufficient documentation

## 2023-04-08 DIAGNOSIS — Z79890 Hormone replacement therapy (postmenopausal): Secondary | ICD-10-CM | POA: Diagnosis not present

## 2023-04-08 DIAGNOSIS — Z7985 Long-term (current) use of injectable non-insulin antidiabetic drugs: Secondary | ICD-10-CM | POA: Insufficient documentation

## 2023-04-08 DIAGNOSIS — Z5309 Procedure and treatment not carried out because of other contraindication: Secondary | ICD-10-CM | POA: Insufficient documentation

## 2023-04-08 DIAGNOSIS — G4730 Sleep apnea, unspecified: Secondary | ICD-10-CM | POA: Insufficient documentation

## 2023-04-08 DIAGNOSIS — I110 Hypertensive heart disease with heart failure: Secondary | ICD-10-CM | POA: Insufficient documentation

## 2023-04-08 DIAGNOSIS — Z6841 Body Mass Index (BMI) 40.0 and over, adult: Secondary | ICD-10-CM | POA: Diagnosis not present

## 2023-04-08 DIAGNOSIS — D649 Anemia, unspecified: Secondary | ICD-10-CM | POA: Diagnosis not present

## 2023-04-08 DIAGNOSIS — Z1211 Encounter for screening for malignant neoplasm of colon: Secondary | ICD-10-CM | POA: Insufficient documentation

## 2023-04-08 DIAGNOSIS — Z87891 Personal history of nicotine dependence: Secondary | ICD-10-CM | POA: Diagnosis not present

## 2023-04-08 DIAGNOSIS — Z860100 Personal history of colon polyps, unspecified: Secondary | ICD-10-CM

## 2023-04-08 HISTORY — PX: COLONOSCOPY WITH PROPOFOL: SHX5780

## 2023-04-08 LAB — GLUCOSE, CAPILLARY: Glucose-Capillary: 168 mg/dL — ABNORMAL HIGH (ref 70–99)

## 2023-04-08 SURGERY — COLONOSCOPY WITH PROPOFOL
Anesthesia: General

## 2023-04-08 MED ORDER — PROPOFOL 500 MG/50ML IV EMUL
INTRAVENOUS | Status: DC | PRN
  Administered 2023-04-08: 110 ug/kg/min via INTRAVENOUS

## 2023-04-08 MED ORDER — LIDOCAINE HCL (CARDIAC) PF 100 MG/5ML IV SOSY
PREFILLED_SYRINGE | INTRAVENOUS | Status: DC | PRN
  Administered 2023-04-08: 20 mg via INTRAVENOUS

## 2023-04-08 MED ORDER — PROPOFOL 10 MG/ML IV BOLUS
INTRAVENOUS | Status: DC | PRN
  Administered 2023-04-08: 30 mg via INTRAVENOUS
  Administered 2023-04-08: 80 mg via INTRAVENOUS

## 2023-04-08 MED ORDER — PROPOFOL 1000 MG/100ML IV EMUL
INTRAVENOUS | Status: AC
  Filled 2023-04-08: qty 100

## 2023-04-08 MED ORDER — SODIUM CHLORIDE 0.9 % IV SOLN: INTRAVENOUS | Status: DC

## 2023-04-08 NOTE — Anesthesia Postprocedure Evaluation (Signed)
Anesthesia Post Note  Patient: Amy Buchanan  Procedure(s) Performed: COLONOSCOPY WITH PROPOFOL  Patient location during evaluation: Endoscopy Anesthesia Type: General Level of consciousness: awake and alert Pain management: pain level controlled Vital Signs Assessment: post-procedure vital signs reviewed and stable Respiratory status: spontaneous breathing, nonlabored ventilation, respiratory function stable and patient connected to nasal cannula oxygen Cardiovascular status: blood pressure returned to baseline and stable Postop Assessment: no apparent nausea or vomiting Anesthetic complications: no   No notable events documented.   Last Vitals:  Vitals:   04/08/23 0852 04/08/23 0912  BP: (!) 165/66 (!) 186/74  Pulse:    Resp:    Temp: (!) 36.1 C   SpO2:      Last Pain:  Vitals:   04/08/23 0912  TempSrc:   PainSc: 0-No pain                 Corinda Gubler

## 2023-04-08 NOTE — H&P (Signed)
Wyline Mood, MD [address], [place], [place], [address] [address], [place], [place], [address] Phone: [phone]  Fax: [phone]  Primary Care Physician:  Oswaldo Conroy, MD   Pre-Procedure History & Physical: HPI:  Amy Buchanan is a 67 y.o. female is here for an colonoscopy.   Past Medical History:  Diagnosis Date   Allergy    Anemia    Arthritis    Basal cell carcinoma (BCC) of eyelid    Breast cancer (HCC) 2007   RT LUMPECTOMY   Carcinoma of right breast (HCC) 2007   T2, N0. ER positive, PR positive, HER-2/neu not overexpressing. Tamoxifen ending 2013, Aromasin started 2013.   CHF (congestive heart failure) (HCC)    Clotting disorder (HCC)    Edema    lower extremity   Gout    Hemorrhoids    Hypertension    Obesity    Radiation 2007   BREAST CA   S/P chemotherapy, time since greater than 12 weeks 2007   BREAST CA   Schizophrenia, schizoaffective, chronic (HCC) 2004   Sleep apnea     Past Surgical History:  Procedure Laterality Date   ABDOMINAL HYSTERECTOMY     partial   BREAST LUMPECTOMY Right 2007   right breast 2 areas   BTL     COLONOSCOPY  2014   COLONOSCOPY WITH PROPOFOL N/A 03/18/2022   Procedure: COLONOSCOPY WITH PROPOFOL;  Surgeon: Wyline Mood, MD;  Location: [facility] ENDOSCOPY;  Service: Gastroenterology;  Laterality: N/A;  Social Services provides transportation for this patient - MID AM   COLONOSCOPY WITH PROPOFOL N/A 03/23/2023   Procedure: COLONOSCOPY WITH PROPOFOL;  Surgeon: Wyline Mood, MD;  Location: [facility] ENDOSCOPY;  Service: Gastroenterology;  Laterality: N/A;   left breast biopsy     neck fusion     TUBAL LIGATION     UPPER GI ENDOSCOPY  2014    Prior to Admission medications   Medication Sig Start Date End Date Taking? Authorizing Provider  amLODipine (NORVASC) 10 MG tablet Take 10 mg by mouth daily.   Yes [provider]  ferrous sulfate 325 (65 FE) MG EC tablet Take 325 mg by mouth daily  at 2 PM daily at 2 PM.   Yes [provider]  furosemide (LASIX) 40 MG tablet Take 40 mg by mouth daily.   Yes [provider]  insulin glargine (LANTUS) 100 UNIT/ML injection Inject 85 Units into the skin at bedtime.   Yes [provider]  levothyroxine (SYNTHROID, LEVOTHROID) 88 MCG tablet Take 88 mcg by mouth daily before breakfast.   Yes [provider]  lisinopril (PRINIVIL,ZESTRIL) 5 MG tablet Take 5 mg by mouth daily.   Yes [provider]  metFORMIN (GLUCOPHAGE) 500 MG tablet Take 500 mg by mouth 2 (two) times daily with a meal.    Yes [provider]  omeprazole (PRILOSEC) 40 MG capsule Take 40 mg by mouth daily.   Yes [provider]  potassium chloride (K-DUR,KLOR-CON) 10 MEQ tablet Take 10 mEq by mouth daily.   Yes [provider]  ARIPiprazole (ABILIFY) 15 MG tablet Take 15 mg by mouth daily.    [provider]  cetirizine (ZYRTEC) 10 MG tablet Take 10 mg by mouth at bedtime.    [provider]  liraglutide (VICTOZA) 18 MG/3ML SOPN Inject 18 mg into the skin daily at 6 (six) AM. Patient not taking: Reported on 03/16/2023    [provider]  meclizine Lezlie Octave)  25 MG tablet Take 25 mg by mouth 3 (three) times daily as needed for nausea.    [provider]  Semaglutide,0.25 or 0.5MG /DOS, (OZEMPIC, 0.25 OR 0.5 MG/DOSE,) 2 MG/1.5ML SOPN Inject into the skin.    [provider]  sertraline (ZOLOFT) 100 MG tablet Take 100 mg by mouth daily.    [provider]    Allergies as of 03/23/2023 - Review Complete 03/23/2023  Allergen Reaction Noted   Other  08/17/2014    Family History  Problem Relation Age of Onset   Heart disease Mother    Cancer Father        lung   Diabetes Brother    Heart disease Brother    Breast cancer Neg Hx     Social History   Socioeconomic History   Marital status: Married    Spouse name: Not on file   Number of children: Not  on file   Years of education: Not on file   Highest education level: Not on file  Occupational History   Not on file  Tobacco Use   Smoking status: Former   Smokeless tobacco: Never  Vaping Use   Vaping status: Never Used  Substance and Sexual Activity   Alcohol use: No    Alcohol/week: 0.0 standard drinks of alcohol   Drug use: No   Sexual activity: Not on file  Other Topics Concern   Not on file  Social History Narrative   Not on file   Social Determinants of Health   Financial Resource Strain: Not on file  Food Insecurity: Not on file  Transportation Needs: Not on file  Physical Activity: Not on file  Stress: Not on file  Social Connections: Not on file  Intimate Partner Violence: Not on file    Review of Systems: See HPI, otherwise negative ROS  Physical Exam: BP (!) 215/62   Pulse (!) 101   Temp (!) 97.5 F (36.4 C) (Temporal)   Resp (!) 22   Ht 5' 1.5" (1.562 m)   Wt 122.2 kg   SpO2 100%   BMI 50.08 kg/m  General:   Alert,  pleasant and cooperative in NAD Head:  Normocephalic and atraumatic. Neck:  Supple; no masses or thyromegaly. Lungs:  Clear throughout to auscultation, normal respiratory effort.    Heart:  +S1, +S2, Regular rate and rhythm, No edema. Abdomen:  Soft, nontender and nondistended. Normal bowel sounds, without guarding, and without rebound.   Neurologic:  Alert and  oriented x4;  grossly normal neurologically.  Impression/Plan: Amy Buchanan is here for an colonoscopy to be performed for surveillance due to prior history of colon polyps   Risks, benefits, limitations, and alternatives regarding  colonoscopy have been reviewed with the patient.  Questions have been answered.  All parties agreeable.   Wyline Mood, MD  04/08/2023, 8:28 AM

## 2023-04-08 NOTE — Op Note (Signed)
[facility] Gastroenterology Patient Name: Amy Buchanan Procedure Date: 04/08/2023 8:06 AM MRN: [id number] Account #: [id number] Date of Birth: 1956-04-12 Admit Type: Outpatient Age: 67 Room: [facility] ENDO ROOM 3 Gender: Female Note Status: Finalized Instrument Name: Prentice Docker [id number] Procedure:             Colonoscopy Indications:           Surveillance: History of numerous (> 10) adenomas on                         last colonoscopy (< 3 yrs) Providers:             Wyline Mood MD, MD Medicines:             Monitored Anesthesia Care Complications:         No immediate complications. Procedure:             Pre-Anesthesia Assessment:                        - Prior to the procedure, a History and Physical was                         performed, and patient medications, allergies and                         sensitivities were reviewed. The patient's tolerance                         of previous anesthesia was reviewed.                        - The risks and benefits of the procedure and the                         sedation options and risks were discussed with the                         patient. All questions were answered and informed                         consent was obtained.                        - ASA Grade Assessment: II - A patient with mild                         systemic disease.                        After obtaining informed consent, the colonoscope was                         passed under direct vision. Throughout the procedure,                         the patient's blood pressure, pulse, and oxygen                         saturations were monitored continuously. The  Colonoscope was introduced through the anus with the                         intention of advancing to the cecum. The scope was                         advanced to the ascending colon before the procedure                         was aborted. Medications were given.  The colonoscopy                         was performed with ease. The patient tolerated the                         procedure well. The quality of the bowel preparation                         was inadequate. Findings:      A large amount of semi-liquid stool was found in the entire colon,       interfering with visualization. Impression:            - Preparation of the colon was inadequate.                        - Stool in the entire examined colon.                        - No specimens collected. Recommendation:        - Discharge patient to home (with escort).                        - Resume previous diet.                        - Continue present medications.                        - Repeat colonoscopy in 4 weeks because the bowel                         preparation was suboptimal. Procedure Code(s):     --- Professional ---                        [phone], 53, Colonoscopy, flexible; diagnostic,                         including collection of specimen(s) by brushing or                         washing, when performed (separate procedure) Diagnosis Code(s):     --- Professional ---                        Z86.010, Personal history of colonic polyps CPT copyright 2022 American Medical Association. All rights reserved. The codes documented in this report are preliminary and upon coder review may  be revised to meet current compliance requirements. Wyline Mood, MD Wyline Mood MD, MD 04/08/2023 8:50:20  AM This report has been signed electronically. Number of Addenda: 0 Note Initiated On: 04/08/2023 8:06 AM Total Procedure Duration: 0 hours 3 minutes 56 seconds  Estimated Blood Loss:  Estimated blood loss: none.      [facility]

## 2023-04-08 NOTE — Transfer of Care (Signed)
Immediate Anesthesia Transfer of Care Note  Patient: Amy Buchanan  rocedure(s) Performed: COLONOSCOPY WITH PROPOFOL  Patient Location: PACU  Anesthesia Type:General  Level of Consciousness: drowsy  Airway & Oxygen Therapy: Patient Spontanous Breathing  Post-op Assessment: Report given to RN and Post -op Vital signs reviewed and stable  Post vital signs: Reviewed and stable  Last Vitals:  Vitals Value Taken Time  BP 165/66 04/08/23   0855  Temp 97.5 04/08/23   0855  Pulse 94 04/08/23 0855  Resp 24 04/08/23 0855  SpO2 100 % 04/08/23 0855  Vitals shown include unfiled device data.  Last Pain:  Vitals:   04/08/23 0852  TempSrc: Temporal  PainSc: Asleep         Complications: No notable events documented.

## 2023-04-08 NOTE — Anesthesia Preprocedure Evaluation (Signed)
Anesthesia Evaluation  Patient identified by MRN, date of birth, ID band Patient awake    Reviewed: Allergy & Precautions, NPO status , Patient's Chart, lab work & pertinent test results  History of Anesthesia Complications Negative for: history of anesthetic complications  Airway Mallampati: III  TM Distance: >3 FB Neck ROM: Full    Dental no notable dental hx. (+) Teeth Intact   Pulmonary sleep apnea and Continuous Positive Airway Pressure Ventilation , neg COPD, Patient abstained from smoking.Not current smoker, former smoker   Pulmonary exam normal breath sounds clear to auscultation       Cardiovascular Exercise Tolerance: Good METShypertension, Pt. on medications +CHF  (-) CAD and (-) Past MI (-) dysrhythmias  Rhythm:Regular Rate:Normal - Systolic murmurs    Neuro/Psych  PSYCHIATRIC DISORDERS    Schizophrenia  negative neurological ROS     GI/Hepatic ,neg GERD  ,,(+)     (-) substance abuse    Endo/Other  neg diabetes  Class 4 obesity  Renal/GU negative Renal ROS     Musculoskeletal  (+) Arthritis ,    Abdominal   Peds  Hematology  (+) Blood dyscrasia, anemia   Anesthesia Other Findings Past Medical History: No date: Allergy No date: Anemia No date: Arthritis No date: Basal cell carcinoma (BCC) of eyelid 2007: Breast cancer (HCC)     Comment:  RT LUMPECTOMY 2007: Carcinoma of right breast (HCC)     Comment:  T2, N0. ER positive, PR positive, HER-2/neu not               overexpressing. Tamoxifen ending 2013, Aromasin started               2013. No date: CHF (congestive heart failure) (HCC) No date: Clotting disorder (HCC) No date: Edema     Comment:  lower extremity No date: Gout No date: Hemorrhoids No date: Hypertension No date: Obesity 2007: Radiation     Comment:  BREAST CA 2007: S/P chemotherapy, time since greater than 12 weeks     Comment:  BREAST CA 2004: Schizophrenia,  schizoaffective, chronic (HCC) No date: Sleep apnea  Reproductive/Obstetrics                             Anesthesia Physical Anesthesia Plan  ASA: 3  Anesthesia Plan: General   Post-op Pain Management: Minimal or no pain anticipated   Induction: Intravenous  PONV Risk Score and Plan: 3 and Propofol infusion, TIVA and Ondansetron  Airway Management Planned: Nasal CPAP and Natural Airway  Additional Equipment: None  Intra-op Plan:   Post-operative Plan:   Informed Consent: I have reviewed the patients History and Physical, chart, labs and discussed the procedure including the risks, benefits and alternatives for the proposed anesthesia with the patient or authorized representative who has indicated his/her understanding and acceptance.     Dental advisory given  Plan Discussed with: CRNA and Surgeon  Anesthesia Plan Comments: (Discussed risks of anesthesia with patient, including possibility of difficulty with spontaneous ventilation under anesthesia necessitating airway intervention, PONV, and rare risks such as cardiac or respiratory or neurological events, and allergic reactions. Discussed the role of CRNA in patient's perioperative care. Patient understands. Patient informed about increased incidence of above perioperative risk due to high BMI. Patient understands.  )       Anesthesia Quick Evaluation

## 2023-04-09 ENCOUNTER — Encounter: Payer: Self-pay | Admitting: Gastroenterology

## 2023-09-11 ENCOUNTER — Other Ambulatory Visit: Payer: Self-pay | Admitting: Family Medicine

## 2023-09-11 ENCOUNTER — Ambulatory Visit
Admission: RE | Admit: 2023-09-11 | Discharge: 2023-09-11 | Disposition: A | Discharge status: Home / Self Care | Attending: Family Medicine | Admitting: Family Medicine

## 2023-09-11 ENCOUNTER — Ambulatory Visit
Admission: RE | Admit: 2023-09-11 | Discharge: 2023-09-11 | Disposition: A | Discharge status: Home / Self Care | Source: Ambulatory Visit | Attending: Family Medicine | Admitting: Family Medicine

## 2023-09-11 DIAGNOSIS — M79672 Pain in left foot: Secondary | ICD-10-CM | POA: Diagnosis present

## 2023-10-07 ENCOUNTER — Ambulatory Visit: Admitting: Podiatry

## 2023-10-12 ENCOUNTER — Encounter: Payer: Self-pay | Admitting: Podiatry

## 2023-10-12 ENCOUNTER — Ambulatory Visit (INDEPENDENT_AMBULATORY_CARE_PROVIDER_SITE_OTHER): Admitting: Podiatry

## 2023-10-12 DIAGNOSIS — L84 Corns and callosities: Secondary | ICD-10-CM

## 2023-10-12 DIAGNOSIS — E119 Type 2 diabetes mellitus without complications: Secondary | ICD-10-CM

## 2023-10-12 NOTE — Patient Instructions (Signed)
 Look for urea 40% cream or ointment and apply to the thickened dry skin / calluses. This can be bought over the counter, at a pharmacy or online such as [employer].

## 2023-10-12 NOTE — Progress Notes (Signed)
  Subjective:  Patient ID: Amy Buchanan, female    DOB: May 23, 1955,  MRN: [id number]  Chief Complaint  Patient presents with   Diabetes    "My calluses are bothering me on both feet.  I can't get them off."    68 y.o. female presents with the above complaint. History confirmed with patient.  She has a history of type 2 diabetes she says is well-controlled.  The calluses are quite thick, she does not put anything on them currently  Objective:  Physical Exam: warm, good capillary refill, normal DP and PT pulses, normal sensory exam, and hyperkeratotic painful calluses bilateral medial hallux, submetatarsal 1 on the left and submetatarsal 5 on the right  Assessment:   1. Callus of foot   2. Type 2 diabetes mellitus without complication, without long-term current use of insulin (HCC)      Plan:  Patient was evaluated and treated and all questions answered.   All symptomatic hyperkeratoses were safely debrided with a sterile #15 blade to patient's level of comfort without incident. We discussed preventative and palliative care of these lesions including supportive and accommodative shoegear, padding, prefabricated and custom molded accommodative orthoses, use of a pumice stone and lotions/creams daily.  Recommend urea 40% cream and pumice stone use daily   Return if symptoms worsen or fail to improve.

## 2023-10-28 ENCOUNTER — Ambulatory Visit (INDEPENDENT_AMBULATORY_CARE_PROVIDER_SITE_OTHER): Admitting: Podiatry

## 2023-10-28 ENCOUNTER — Encounter: Payer: Self-pay | Admitting: Podiatry

## 2023-10-28 DIAGNOSIS — S9032XA Contusion of left foot, initial encounter: Secondary | ICD-10-CM | POA: Diagnosis not present

## 2023-10-30 NOTE — Progress Notes (Signed)
  Subjective:  Patient ID: Amy Buchanan, female    DOB: 1955/10/28,  MRN: [id number]  Chief Complaint  Patient presents with   Diabetes    I want him to look at the back of my left heel.  I hit it really hard on the back of a chair.  It hasn't hurt the past four days.    68 y.o. female presents with the above complaint. History confirmed with patient.  Has been tender has been able to ambulate pain is improved in the last 1 to 2 days  Objective:  Physical Exam: warm, good capillary refill, no trophic changes or ulcerative lesions, normal DP and PT pulses, and left heel has tenderness on the posterior calcaneus, no deep tissue injury blistering bruising or ecchymosis full strength and range of motion of the Achilles without discontinuity. Assessment:   1. Contusion of foot or heel, left, initial encounter      Plan:  Patient was evaluated and treated and all questions answered.  .  She likely has a soft tissue and bony contusion of the posterior heel there is no evidence of skin compromise and she is beginning to improve.  We reviewed RICE protocol icing and keeping pressure off of the heel.  OTC Tylenol or NSAIDs as needed for pain control.  Follow-up as needed if this does not improve in 3 to 4 weeks. Return if symptoms worsen or fail to improve.

## 2024-01-29 ENCOUNTER — Other Ambulatory Visit: Payer: Self-pay | Admitting: Family Medicine

## 2024-01-29 DIAGNOSIS — Z1231 Encounter for screening mammogram for malignant neoplasm of breast: Secondary | ICD-10-CM

## 2024-03-14 ENCOUNTER — Ambulatory Visit
Admission: RE | Admit: 2024-03-14 | Discharge: 2024-03-14 | Disposition: A | Discharge status: Home / Self Care | Source: Ambulatory Visit | Attending: Family Medicine | Admitting: Family Medicine

## 2024-03-14 DIAGNOSIS — Z1231 Encounter for screening mammogram for malignant neoplasm of breast: Secondary | ICD-10-CM | POA: Insufficient documentation

## 2024-03-22 ENCOUNTER — Other Ambulatory Visit: Payer: Self-pay | Admitting: Physician Assistant

## 2024-03-22 DIAGNOSIS — R928 Other abnormal and inconclusive findings on diagnostic imaging of breast: Secondary | ICD-10-CM

## 2024-03-23 ENCOUNTER — Ambulatory Visit: Admitting: Podiatry

## 2024-03-25 ENCOUNTER — Ambulatory Visit
Admission: RE | Admit: 2024-03-25 | Discharge: 2024-03-25 | Disposition: A | Discharge status: Home / Self Care | Source: Ambulatory Visit | Attending: Physician Assistant | Admitting: Physician Assistant

## 2024-03-25 DIAGNOSIS — R928 Other abnormal and inconclusive findings on diagnostic imaging of breast: Secondary | ICD-10-CM | POA: Insufficient documentation
